# Patient Record
Sex: Male | Born: 1985 | Race: Black or African American | Hispanic: No | Marital: Single | Smoking: Current every day smoker
Health system: Southern US, Community
[De-identification: ages and names within clinical notes are randomized; demographics above are authoritative.]

## PROBLEM LIST (undated history)

## (undated) ENCOUNTER — Emergency Department: Payer: BC Managed Care – PPO

## (undated) DIAGNOSIS — I10 Essential (primary) hypertension: Secondary | ICD-10-CM

---
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
## 2005-09-01 ENCOUNTER — Emergency Department (HOSPITAL_COMMUNITY): Admission: EM | Admit: 2005-09-01 | Discharge: 2005-09-01 | Payer: Self-pay | Admitting: Family Medicine

## 2006-03-31 ENCOUNTER — Emergency Department (HOSPITAL_COMMUNITY): Admission: EM | Admit: 2006-03-31 | Discharge: 2006-03-31 | Payer: Self-pay | Admitting: Emergency Medicine

## 2008-03-26 ENCOUNTER — Emergency Department (HOSPITAL_COMMUNITY): Admission: EM | Admit: 2008-03-26 | Discharge: 2008-03-26 | Payer: Self-pay | Admitting: Emergency Medicine

## 2008-03-28 ENCOUNTER — Emergency Department (HOSPITAL_COMMUNITY): Admission: EM | Admit: 2008-03-28 | Discharge: 2008-03-28 | Payer: Self-pay | Admitting: Emergency Medicine

## 2008-09-06 ENCOUNTER — Emergency Department (HOSPITAL_COMMUNITY): Admission: EM | Admit: 2008-09-06 | Discharge: 2008-09-06 | Payer: Self-pay | Admitting: Emergency Medicine

## 2010-08-09 ENCOUNTER — Inpatient Hospital Stay (INDEPENDENT_AMBULATORY_CARE_PROVIDER_SITE_OTHER)
Admission: RE | Admit: 2010-08-09 | Discharge: 2010-08-09 | Disposition: A | Discharge status: Home / Self Care | Payer: Self-pay | Source: Ambulatory Visit | Attending: Emergency Medicine | Admitting: Emergency Medicine

## 2010-08-09 ENCOUNTER — Ambulatory Visit (INDEPENDENT_AMBULATORY_CARE_PROVIDER_SITE_OTHER): Payer: Self-pay

## 2010-08-09 DIAGNOSIS — J45909 Unspecified asthma, uncomplicated: Secondary | ICD-10-CM

## 2010-08-09 LAB — POCT URINALYSIS DIPSTICK
Hgb urine dipstick: NEGATIVE
Nitrite: NEGATIVE
Specific Gravity, Urine: >=1.03 (ref 1.005–1.030)
pH: 5.5 (ref 5.0–8.0)

## 2010-08-10 ENCOUNTER — Emergency Department (HOSPITAL_COMMUNITY)
Admission: EM | Admit: 2010-08-10 | Discharge: 2010-08-10 | Disposition: A | Discharge status: Home / Self Care | Payer: Self-pay | Attending: Emergency Medicine | Admitting: Emergency Medicine

## 2010-08-10 DIAGNOSIS — E669 Obesity, unspecified: Secondary | ICD-10-CM | POA: Insufficient documentation

## 2010-08-10 DIAGNOSIS — Z79899 Other long term (current) drug therapy: Secondary | ICD-10-CM | POA: Insufficient documentation

## 2010-08-10 DIAGNOSIS — F411 Generalized anxiety disorder: Secondary | ICD-10-CM | POA: Insufficient documentation

## 2010-08-10 DIAGNOSIS — J45909 Unspecified asthma, uncomplicated: Secondary | ICD-10-CM | POA: Insufficient documentation

## 2010-08-10 DIAGNOSIS — F1210 Cannabis abuse, uncomplicated: Secondary | ICD-10-CM | POA: Insufficient documentation

## 2010-08-11 ENCOUNTER — Emergency Department (HOSPITAL_COMMUNITY)
Admission: EM | Admit: 2010-08-11 | Discharge: 2010-08-12 | Disposition: A | Discharge status: Home / Self Care | Payer: Self-pay | Attending: Emergency Medicine | Admitting: Emergency Medicine

## 2010-08-11 DIAGNOSIS — J45909 Unspecified asthma, uncomplicated: Secondary | ICD-10-CM | POA: Insufficient documentation

## 2010-08-11 DIAGNOSIS — R9431 Abnormal electrocardiogram [ECG] [EKG]: Secondary | ICD-10-CM | POA: Insufficient documentation

## 2010-08-11 DIAGNOSIS — R079 Chest pain, unspecified: Secondary | ICD-10-CM | POA: Insufficient documentation

## 2010-08-11 DIAGNOSIS — F411 Generalized anxiety disorder: Secondary | ICD-10-CM | POA: Insufficient documentation

## 2010-08-11 DIAGNOSIS — H5789 Other specified disorders of eye and adnexa: Secondary | ICD-10-CM | POA: Insufficient documentation

## 2010-08-12 ENCOUNTER — Observation Stay (HOSPITAL_COMMUNITY)
Admission: EM | Admit: 2010-08-12 | Discharge: 2010-08-13 | Disposition: A | Discharge status: Home / Self Care | Payer: Self-pay | Attending: Internal Medicine | Admitting: Internal Medicine

## 2010-08-12 DIAGNOSIS — F411 Generalized anxiety disorder: Secondary | ICD-10-CM | POA: Insufficient documentation

## 2010-08-12 DIAGNOSIS — I10 Essential (primary) hypertension: Secondary | ICD-10-CM | POA: Insufficient documentation

## 2010-08-12 DIAGNOSIS — J45909 Unspecified asthma, uncomplicated: Secondary | ICD-10-CM | POA: Insufficient documentation

## 2010-08-12 DIAGNOSIS — R079 Chest pain, unspecified: Principal | ICD-10-CM | POA: Insufficient documentation

## 2010-08-12 DIAGNOSIS — R7309 Other abnormal glucose: Secondary | ICD-10-CM | POA: Insufficient documentation

## 2010-08-12 DIAGNOSIS — F17200 Nicotine dependence, unspecified, uncomplicated: Secondary | ICD-10-CM | POA: Insufficient documentation

## 2010-08-12 LAB — PROTIME-INR: INR: 0.95 (ref 0.00–1.49)

## 2010-08-12 LAB — POCT CARDIAC MARKERS
CKMB, poc: <1 ng/mL — ABNORMAL LOW (ref 1.0–8.0)
Troponin i, poc: <0.05 ng/mL (ref 0.00–0.09)
Troponin i, poc: <0.05 ng/mL (ref 0.00–0.09)

## 2010-08-12 LAB — URINALYSIS, ROUTINE W REFLEX MICROSCOPIC
Ketones, ur: NEGATIVE mg/dL
Nitrite: NEGATIVE
Protein, ur: NEGATIVE mg/dL
Specific Gravity, Urine: 1.007 (ref 1.005–1.030)
Urine Glucose, Fasting: NEGATIVE mg/dL

## 2010-08-12 LAB — DIFFERENTIAL
Basophils Relative: 1 % (ref 0–1)
Eosinophils Absolute: 0.1 10*3/uL (ref 0.0–0.7)
Neutrophils Relative %: 57 % (ref 43–77)

## 2010-08-12 LAB — CBC
MCH: 26.2 pg (ref 26.0–34.0)
Platelets: 237 10*3/uL (ref 150–400)
RBC: 5.61 MIL/uL (ref 4.22–5.81)
WBC: 6.1 10*3/uL (ref 4.0–10.5)

## 2010-08-12 LAB — CARDIAC PANEL(CRET KIN+CKTOT+MB+TROPI): CK, MB: 1.3 ng/mL (ref 0.3–4.0)

## 2010-08-12 LAB — POCT I-STAT, CHEM 8
Calcium, Ion: 1.06 mmol/L — ABNORMAL LOW (ref 1.12–1.32)
HCT: 49 % (ref 39.0–52.0)
TCO2: 22 mmol/L (ref 0–100)

## 2010-08-12 LAB — CK TOTAL AND CKMB (NOT AT ARMC): Relative Index: 1.4 (ref 0.0–2.5)

## 2010-08-12 LAB — RAPID URINE DRUG SCREEN, HOSP PERFORMED
Amphetamines: NOT DETECTED
Cocaine: NOT DETECTED
Tetrahydrocannabinol: NOT DETECTED

## 2010-08-12 LAB — APTT: aPTT: 25 seconds (ref 24–37)

## 2010-08-12 LAB — BRAIN NATRIURETIC PEPTIDE: Pro B Natriuretic peptide (BNP): <30 pg/mL (ref 0.0–100.0)

## 2010-08-12 LAB — TSH: TSH: 0.4 u[IU]/mL (ref 0.350–4.500)

## 2010-08-12 NOTE — H&P (Signed)
Christian Wright, FARACE                 ACCOUNT NO.:  [id number]  MEDICAL RECORD NO.:  [id number]           PATIENT TYPE:  E  LOCATION:  [facility]                         FACILITY:  [facility]  PHYSICIAN:  Conley Canal, MD      DATE OF BIRTH:  1986/05/17  DATE OF ADMISSION:  08/12/2010 DATE OF DISCHARGE:                             HISTORY & PHYSICAL   The patient has no primary care physician.  CHIEF COMPLAINT:  Left-sided chest pain, times a few days.  HISTORY OF PRESENT ILLNESS:  This is a 25 year old male with a history of tobacco abuse, hypertension, not on medications, morbid obesity, tobacco habituation, bronchial asthma, anxiety disorder came in with complaints of left-sided chest pain which he says is on and off, no relieving or aggravating factors.  It has lately been associated with numbness of the left arm.  He denies any diaphoresis.  No nausea or vomiting.  The patient currently is chest-pain free.  The pain is mild and in the emergency room the patient had chest x-ray which was unrevealing.  He also had an EKG which showed some T-wave flattening in the septal leads hence referred to hospitalist's service for admission. His D-dimer was negative.  PAST MEDICAL HISTORY: 1. Tobacco habituation. 2. Hypertension. 3. Morbid obesity. 4. Panic anxiety disorder.  HOME MEDICATIONS:  None.  ALLERGIES:  NO KNOWN DRUG ALLERGIES.  SOCIAL HISTORY:  The patient smokes cigarettes occasionally, drinks alcohol, and has history of polysubstance abuse with cocaine and marijuana.  FAMILY HISTORY:  Positive for diabetes mellitus, hypertension, heart disease on his father's and mother's side.  REVIEW OF SYSTEMS:  Unremarkable except as highlighted in the history of present illness.  PHYSICAL EXAMINATION:  GENERAL:  This is a young male who is not in acute distress accompanied by his aunt at the bedside. VITAL SIGNS:  Blood pressure 166/90, heart rate is 86, temperature 98.8, respirations  16, oxygen saturation is 98% on room air.  HEAD, EARS, NOSE AND THROAT:  Pupils equal and reacting to light.  No jugular venous distention.  No carotid bruits. CARDIAC:  First, second heart sounds heard.  No murmurs.  Pulse regular. ABDOMEN:  Soft, nontender.  No palpable organomegaly.  Bowel sounds are normal. CNS:  The patient is alert and oriented to person, place, and time with no acute focal neurological deficits. EXTREMITIES:  No pedal edema.  Peripheral pulses equal.  IMPRESSION:  A 25 year old male who is presenting with chest pain on and off concerning for acute coronary syndrome. 1. Chest pain.  We will admit the patient to telemetry observation,     send serial cardiac enzymes, obtain 2-D echocardiogram.  He may     need stress test.  Meanwhile, will be on nitroglycerin, aspirin,     beta blocker, statin.  We will obtain lipids panel, TSH. 2. Hypertension.  We will start beta-blocker, possibly add calcium     channel blocker depending on how he is doing. 3. Tobacco habituation, polysubstance abuse, cessation counseling was     given.  We will provide nicotine patch. 4. Bronchial asthma.  The patient occasionally uses albuterol  inhaler,     currently stable.  Continue albuterol inhalation as needed. 5. The patient's condition is fair.     Conley Canal, MD     SR/MEDQ  D:  08/12/2010  T:  08/12/2010  Job:  [id number]  Electronically Signed by Conley Canal  on 08/12/2010 05:23:59 PM

## 2010-08-13 LAB — URINE CULTURE
Colony Count: NO GROWTH
Culture  Setup Time: 201202291529
Culture: NO GROWTH

## 2010-08-13 LAB — CARDIAC PANEL(CRET KIN+CKTOT+MB+TROPI)
CK, MB: 1.2 ng/mL (ref 0.3–4.0)
Relative Index: 1.1 (ref 0.0–2.5)
Total CK: 109 U/L (ref 7–232)
Troponin I: 0.01 ng/mL (ref 0.00–0.06)

## 2010-08-13 LAB — LIPID PANEL: Triglycerides: 157 mg/dL — ABNORMAL HIGH (ref <150)

## 2010-08-18 ENCOUNTER — Emergency Department (HOSPITAL_COMMUNITY)
Admission: EM | Admit: 2010-08-18 | Discharge: 2010-08-19 | Disposition: A | Discharge status: Home / Self Care | Payer: Self-pay | Attending: Emergency Medicine | Admitting: Emergency Medicine

## 2010-08-18 DIAGNOSIS — I10 Essential (primary) hypertension: Secondary | ICD-10-CM | POA: Insufficient documentation

## 2010-08-18 DIAGNOSIS — J45909 Unspecified asthma, uncomplicated: Secondary | ICD-10-CM | POA: Insufficient documentation

## 2010-08-18 DIAGNOSIS — R209 Unspecified disturbances of skin sensation: Secondary | ICD-10-CM | POA: Insufficient documentation

## 2010-08-18 LAB — POCT I-STAT, CHEM 8
Calcium, Ion: 1.07 mmol/L — ABNORMAL LOW (ref 1.12–1.32)
Glucose, Bld: 120 mg/dL — ABNORMAL HIGH (ref 70–99)
HCT: 54 % — ABNORMAL HIGH (ref 39.0–52.0)
Hemoglobin: 18.4 g/dL — ABNORMAL HIGH (ref 13.0–17.0)
TCO2: 24 mmol/L (ref 0–100)

## 2010-08-23 NOTE — Discharge Summary (Signed)
  NAMEANDRELL, Christian Wright                 ACCOUNT NO.:  [id number]  MEDICAL RECORD NO.:  [id number]           PATIENT TYPE:  I  LOCATION:  3733                         FACILITY:  [facility]  PHYSICIAN:  Lonia Blood, M.D.       DATE OF BIRTH:  1985/12/12  DATE OF ADMISSION:  08/12/2010 DATE OF DISCHARGE:  08/13/2010                              DISCHARGE SUMMARY   PRIMARY CARE PHYSICIAN:  This patient has been referred to [facility].  DISCHARGE DIAGNOSES: 1. Left-sided chest pain, probably musculoskeletal in nature. 2. Hypertension. 3. Morbid obesity. 4. Impaired glucose tolerance with metabolic syndrome.  DISCHARGE MEDICATIONS: 1. Albuterol inhaled 2 puffs twice a day as needed. 2. Nicotine 7 mg transdermally daily. 3. Triamterene/HCTZ 37.5/25 daily.  PROCEDURE DURING THIS ADMISSION:  Christian Wright underwent chest x-ray PA and lateral, which was showing normal chest.  CONSULTATION DURING ADMISSION:  No consultations obtained.  HISTORY AND PHYSICAL:  Refer to dictated H and P done by Dr. Venetia Constable.  HOSPITAL COURSE:  Christian Wright is a 25 year old gentleman with tobacco abuse and obesity, was admitted from the emergency room with chest pain. Even though his EKG was interpreted as abnormal, I have personally reviewed the EKG and it looked completely normal.  Three sets of cardiac enzymes were within normal limits.  The patient's chest pain subsided. It sounded more musculoskeletal in nature than angina.  Christian Wright was educated about risk factor reduction, namely tobacco abuse, obesity, and hypertension.  He was set up with a new primary care physician at [facility].     Lonia Blood, M.D.     SL/MEDQ  D:  08/17/2010  T:  08/17/2010  Job:  [id number]  Electronically Signed by Lonia Blood M.D. on 08/23/2010 08:54:09 AM

## 2010-09-24 LAB — DIFFERENTIAL
Basophils Absolute: 0 10*3/uL (ref 0.0–0.1)
Basophils Relative: 0 % (ref 0–1)
Eosinophils Absolute: 0.1 10*3/uL (ref 0.0–0.7)
Neutro Abs: 3.3 10*3/uL (ref 1.7–7.7)
Neutrophils Relative %: 61 % (ref 43–77)

## 2010-09-24 LAB — RAPID URINE DRUG SCREEN, HOSP PERFORMED
Barbiturates: NOT DETECTED
Benzodiazepines: NOT DETECTED
Cocaine: NOT DETECTED
Opiates: NOT DETECTED

## 2010-09-24 LAB — CBC
MCHC: 34.5 g/dL (ref 30.0–36.0)
MCV: 78.7 fL (ref 78.0–100.0)
RDW: 13.5 % (ref 11.5–15.5)

## 2010-09-24 LAB — POCT I-STAT, CHEM 8
BUN: 14 mg/dL (ref 6–23)
Calcium, Ion: 1.05 mmol/L — ABNORMAL LOW (ref 1.12–1.32)
Creatinine, Ser: 1.2 mg/dL (ref 0.4–1.5)
Glucose, Bld: 137 mg/dL — ABNORMAL HIGH (ref 70–99)
Hemoglobin: 17.7 g/dL — ABNORMAL HIGH (ref 13.0–17.0)
Sodium: 142 mEq/L (ref 135–145)
TCO2: 23 mmol/L (ref 0–100)

## 2010-09-24 LAB — URINALYSIS, ROUTINE W REFLEX MICROSCOPIC
Glucose, UA: NEGATIVE mg/dL
Nitrite: NEGATIVE
Specific Gravity, Urine: 1.03 (ref 1.005–1.030)
pH: 6 (ref 5.0–8.0)

## 2010-09-24 LAB — TSH: TSH: 0.312 u[IU]/mL — ABNORMAL LOW (ref 0.350–4.500)

## 2010-09-24 LAB — POCT CARDIAC MARKERS: Myoglobin, poc: 53.1 ng/mL (ref 12–200)

## 2010-09-24 LAB — APTT: aPTT: 26 seconds (ref 24–37)

## 2010-09-24 LAB — PROTIME-INR: INR: 1.1 (ref 0.00–1.49)

## 2011-09-11 ENCOUNTER — Emergency Department (HOSPITAL_COMMUNITY)
Admission: EM | Admit: 2011-09-11 | Discharge: 2011-09-12 | Disposition: A | Discharge status: Home / Self Care | Payer: Self-pay | Attending: Emergency Medicine | Admitting: Emergency Medicine

## 2011-09-11 ENCOUNTER — Encounter (HOSPITAL_COMMUNITY): Payer: Self-pay | Admitting: Emergency Medicine

## 2011-09-11 DIAGNOSIS — J45909 Unspecified asthma, uncomplicated: Secondary | ICD-10-CM | POA: Insufficient documentation

## 2011-09-11 DIAGNOSIS — K602 Anal fissure, unspecified: Secondary | ICD-10-CM | POA: Insufficient documentation

## 2011-09-11 LAB — OCCULT BLOOD, POC DEVICE: Fecal Occult Bld: NEGATIVE

## 2011-09-11 MED ORDER — HYDROCORTISONE ACETATE 25 MG RE SUPP: 25.0000 mg | Freq: Two times a day (BID) | RECTAL | Status: AC

## 2011-09-11 NOTE — ED Notes (Signed)
Called pt twice no answer.

## 2011-09-11 NOTE — ED Notes (Signed)
PT. REPORTS RECTAL PAIN AFTER BOWEL MOVEMENT ONSET 3 DAYS AGO  , DENIES BLEEDING OR CONSTIPATION .

## 2011-09-11 NOTE — Discharge Instructions (Signed)
Anal Fissure, Adult An anal fissure is a small tear or crack in the skin around the anus. Bleeding from a fissure usually stops on its own within a few minutes. However, bleeding will often reoccur with each bowel movement until the crack heals.  CAUSES   Passing large, hard stools.   Frequent diarrheal stools.   Constipation.   Inflammatory bowel disease (Crohn's disease or ulcerative colitis).   Infections.   Anal sex.  SYMPTOMS   Small amounts of blood seen on your stools, on toilet paper, or in the toilet after a bowel movement.   Rectal bleeding.   Painful bowel movements.   Itching or irritation around the anus.  DIAGNOSIS Your caregiver will examine the anal area. An anal fissure can usually be seen with careful inspection. A rectal exam may be performed and a short tube (anoscope) may be used to examine the anal canal. TREATMENT   You may be instructed to take fiber supplements. These supplements can soften your stool to help make bowel movements easier.   Sitz baths may be recommended to help heal the tear. Do not use soap in the sitz baths.   A medicated cream or ointment may be prescribed to lessen discomfort.  HOME CARE INSTRUCTIONS   Maintain a diet high in fruits, whole grains, and vegetables. Avoid constipating foods like bananas and dairy products.   Take sitz baths as directed by your caregiver.   Drink enough fluids to keep your urine clear or pale yellow.   Only take over-the-counter or prescription medicines for pain, discomfort, or fever as directed by your caregiver. Do not take aspirin as this may increase bleeding.   Do not use ointments containing numbing medications (anesthetics) or hydrocortisone. They could slow healing.  SEEK MEDICAL CARE IF:   Your fissure is not completely healed within 3 days.   You have further bleeding.   You have a fever.   You have diarrhea mixed with blood.   You have pain.   Your problem is getting worse  rather than better.  MAKE SURE YOU:   Understand these instructions.   Will watch your condition.   Will get help right away if you are not doing well or get worse.  Document Released: 05/31/2005 Document Revised: 05/20/2011 Document Reviewed: 11/15/2010 [employer] Patient Information 2012 [employer]. 

## 2011-09-11 NOTE — ED Provider Notes (Signed)
History     CSN: [id number]  Arrival date & time 09/11/11  2030   First MD Initiated Contact with Patient 09/11/11 2300      Chief Complaint  Patient presents with  . Rectal Pain    (Consider location/radiation/quality/duration/timing/severity/associated sxs/prior treatment) HPI Comments: Patient here with rectal pain - states that he had diarrhea about 4-5 days ago and thinks that he may have irritated the rectal area, states now pain with bowel movement and blood on the tissue when he wipes - no fever, chills, abdominal pain, nausea, vomiting - states the diarrhea has eased.  Patient is a 26 y.o. male presenting with hematochezia. The history is provided by the patient. No language interpreter was used.  Rectal Bleeding  The current episode started 2 days ago. The onset was gradual. The problem occurs rarely. The problem has been unchanged. The pain is severe. The stool is described as liquid. There was no prior successful therapy. There was no prior unsuccessful therapy. Associated symptoms include diarrhea and rectal pain. Pertinent negatives include no anorexia, no fever, no abdominal pain, no hematemesis, no hemorrhoids, no nausea, no vomiting, no hematuria, no vaginal bleeding, no vaginal discharge, no chest pain, no headaches, no coughing, no difficulty breathing and no rash. He has been eating and drinking normally. Urine output has been normal.    Past Medical History  Diagnosis Date  . Asthma     History reviewed. No pertinent past surgical history.  History reviewed. No pertinent family history.  History  Substance Use Topics  . Smoking status: Current Everyday Smoker -- 0.5 packs/day  . Smokeless tobacco: Not on file  . Alcohol Use: 2.5 oz/week    5 drink(s) per week      Review of Systems  Constitutional: Negative for fever.  Respiratory: Negative for cough.   Cardiovascular: Negative for chest pain.  Gastrointestinal: Positive for diarrhea, blood in stool,  hematochezia and rectal pain. Negative for nausea, vomiting, abdominal pain, constipation, anorexia, hematemesis and hemorrhoids.  Genitourinary: Negative for hematuria, vaginal bleeding and vaginal discharge.  Skin: Negative for rash.  Neurological: Negative for headaches.  All other systems reviewed and are negative.    Allergies  Review of patient's allergies indicates no known allergies.  Home Medications   Current Outpatient Rx  Name Route Sig Dispense Refill  . ALBUTEROL SULFATE HFA 108 (90 BASE) MCG/ACT IN AERS Inhalation Inhale 2 puffs into the lungs every 6 (six) hours as needed. For wheezing    . AMLODIPINE BESYLATE PO Oral Take 1 tablet by mouth daily.    Marland Kitchen HYDROCODONE-ACETAMINOPHEN 7.5-325 MG PO TABS Oral Take 1 tablet by mouth every 6 (six) hours as needed. For pain    . ADULT MULTIVITAMIN W/MINERALS CH Oral Take 1 tablet by mouth daily.    Marland Kitchen PRESCRIPTION MEDICATION Oral Take 1 tablet by mouth daily. Pt takes a combination blood pressure med that has Hydrochlorothizide in it. Unable to verify medicaition because [facility] is closed. RX Capture did not yield any results.      BP 155/92  Pulse 93  Temp(Src) 99.4 F (37.4 C) (Oral)  Resp 20  SpO2 99%  Physical Exam  Nursing note and vitals reviewed. Constitutional: He is oriented to person, place, and time. He appears well-developed and well-nourished. No distress.  HENT:  Head: Normocephalic and atraumatic.  Right Ear: External ear normal.  Left Ear: External ear normal.  Nose: Nose normal.  Mouth/Throat: Oropharynx is clear and moist. No oropharyngeal exudate.  Eyes: Conjunctivae  are normal. Pupils are equal, round, and reactive to light. No scleral icterus.  Neck: Normal range of motion. Neck supple.  Cardiovascular: Normal rate, regular rhythm and normal heart sounds.  Exam reveals no gallop and no friction rub.   No murmur heard. Pulmonary/Chest: Effort normal and breath sounds normal. No respiratory distress.  He has no wheezes. He has no rales. He exhibits no tenderness.  Abdominal: Soft. Bowel sounds are normal. He exhibits no distension and no mass. There is no tenderness. There is no rebound and no guarding.  Genitourinary: Rectal exam shows fissure. Rectal exam shows no external hemorrhoid, no internal hemorrhoid, no mass and anal tone normal. Guaiac negative stool.  Musculoskeletal: Normal range of motion. He exhibits no edema and no tenderness.  Lymphadenopathy:    He has no cervical adenopathy.  Neurological: He is alert and oriented to person, place, and time. No cranial nerve deficit.  Skin: Skin is warm and dry. No rash noted. No erythema. No pallor.  Psychiatric: He has a normal mood and affect. His behavior is normal. Judgment and thought content normal.    ED Course  Procedures (including critical care time)   Labs Reviewed  OCCULT BLOOD, POC DEVICE   No results found.   Anal Fissure    MDM  Patient with rectal fissure based on examination.  Will start on anusol suppositories for the pain - hemoccult negative.        Izola Price [place], [place] 09/11/11 2352

## 2011-09-13 NOTE — ED Provider Notes (Signed)
Medical screening examination/treatment/procedure(s) were performed by non-physician practitioner and as supervising physician I was immediately available for consultation/collaboration.   Laray Anger, DO 09/13/11 [phone]

## 2013-04-24 ENCOUNTER — Encounter (HOSPITAL_COMMUNITY): Payer: Self-pay | Admitting: Emergency Medicine

## 2013-04-24 ENCOUNTER — Emergency Department (INDEPENDENT_AMBULATORY_CARE_PROVIDER_SITE_OTHER)
Admission: EM | Admit: 2013-04-24 | Discharge: 2013-04-24 | Disposition: A | Discharge status: Home / Self Care | Payer: Self-pay | Source: Home / Self Care | Attending: Family Medicine | Admitting: Family Medicine

## 2013-04-24 DIAGNOSIS — J45901 Unspecified asthma with (acute) exacerbation: Secondary | ICD-10-CM

## 2013-04-24 DIAGNOSIS — I10 Essential (primary) hypertension: Secondary | ICD-10-CM

## 2013-04-24 HISTORY — DX: Essential (primary) hypertension: I10

## 2013-04-24 MED ORDER — TRIAMTERENE-HCTZ 75-50 MG PO TABS: 1.0000 | ORAL_TABLET | Freq: Every day | ORAL | Status: DC

## 2013-04-24 MED ORDER — ALBUTEROL SULFATE (5 MG/ML) 0.5% IN NEBU
5.0000 mg | INHALATION_SOLUTION | Freq: Once | RESPIRATORY_TRACT | Status: AC
  Administered 2013-04-24: 5 mg via RESPIRATORY_TRACT

## 2013-04-24 MED ORDER — PREDNISONE 20 MG PO TABS
ORAL_TABLET | ORAL | Status: AC
  Filled 2013-04-24: qty 2

## 2013-04-24 MED ORDER — PREDNISONE 20 MG PO TABS
40.0000 mg | ORAL_TABLET | Freq: Once | ORAL | Status: AC
  Administered 2013-04-24: 40 mg via ORAL

## 2013-04-24 MED ORDER — HYDROCODONE-ACETAMINOPHEN 5-325 MG PO TABS: 0.5000 | ORAL_TABLET | Freq: Every evening | ORAL | Status: DC | PRN

## 2013-04-24 MED ORDER — AMLODIPINE BESYLATE 10 MG PO TABS: 10.0000 mg | ORAL_TABLET | Freq: Every day | ORAL | Status: DC

## 2013-04-24 MED ORDER — IPRATROPIUM BROMIDE 0.02 % IN SOLN
RESPIRATORY_TRACT | Status: AC
  Filled 2013-04-24: qty 2.5

## 2013-04-24 MED ORDER — ALBUTEROL SULFATE (5 MG/ML) 0.5% IN NEBU
INHALATION_SOLUTION | RESPIRATORY_TRACT | Status: AC
  Filled 2013-04-24: qty 1

## 2013-04-24 MED ORDER — PREDNISONE 50 MG PO TABS: 50.0000 mg | ORAL_TABLET | Freq: Every day | ORAL | Status: DC

## 2013-04-24 MED ORDER — ALBUTEROL SULFATE (5 MG/ML) 0.5% IN NEBU: 5.0000 mg | INHALATION_SOLUTION | RESPIRATORY_TRACT | Status: DC | PRN

## 2013-04-24 MED ORDER — IPRATROPIUM BROMIDE 0.02 % IN SOLN
0.5000 mg | Freq: Once | RESPIRATORY_TRACT | Status: AC
  Administered 2013-04-24: 0.5 mg via RESPIRATORY_TRACT

## 2013-04-24 NOTE — ED Notes (Signed)
C/o waking up yesterday with productive cough white and clear sputum,  and wheezing.  Audible wheezing.  BP elevated. Has been out of BP medicine for 2-3 [place].  No fever, sore throat, earache but has had a runny nose.

## 2013-04-24 NOTE — ED Provider Notes (Signed)
Christian Wright is a 27 y.o. male who presents to Urgent Care today for wheezing coughing congestion for 1-2 days. Patient has a medical history for asthma. He notes worsening wheezing starting yesterday. He does not have albuterol inhalers or nebulizer. He denies any chest pain palpitations nausea vomiting or diarrhea. He does not cough and nasal congestion. He's tried some over-the-counter medications for cough which have not helped much. Additionally patient notes that he has run out of his blood pressure medications. He denies any chest pains headache syncope or dizziness.   Past Medical History  Diagnosis Date  . Asthma   . Hypertension    History  Substance Use Topics  . Smoking status: Current Every Day Smoker -- 0.50 packs/day    Types: Cigarettes  . Smokeless tobacco: Not on file  . Alcohol Use: 2.5 oz/week    5 drink(s) per week     Comment: occasional   ROS as above Medications reviewed. Current Facility-Administered Medications  Medication Dose Route Frequency Provider Last Rate Last Dose  . albuterol (PROVENTIL) (5 MG/ML) 0.5% nebulizer solution 5 mg  5 mg Nebulization Once Rodolph Bong, MD       Current Outpatient Prescriptions  Medication Sig Dispense Refill  . albuterol (PROVENTIL HFA;VENTOLIN HFA) 108 (90 BASE) MCG/ACT inhaler Inhale 2 puffs into the lungs every 6 (six) hours as needed. For wheezing      . Multiple Vitamin (MULITIVITAMIN WITH MINERALS) TABS Take 1 tablet by mouth daily.      Marland Kitchen albuterol (PROVENTIL) (5 MG/ML) 0.5% nebulizer solution Take 1 mL (5 mg total) by nebulization every 4 (four) hours as needed for wheezing or shortness of breath.  20 mL  2  . amLODipine (NORVASC) 10 MG tablet Take 1 tablet (10 mg total) by mouth daily.  30 tablet  0  . HYDROcodone-acetaminophen (NORCO/VICODIN) 5-325 MG per tablet Take 0.5 tablets by mouth at bedtime as needed (cough).  6 tablet  0  . predniSONE (DELTASONE) 50 MG tablet Take 1 tablet (50 mg total) by mouth  daily.  5 tablet  0  . triamterene-hydrochlorothiazide (MAXZIDE) 75-50 MG per tablet Take 1 tablet by mouth daily.  30 tablet  0    Exam:  BP 209/119  Pulse 126  Temp(Src) 98.5 F (36.9 C) (Oral)  Resp 24  SpO2 96% Gen: Well NAD HEENT: EOMI,  MMM Lungs: Increased work of breathing. Wheezing present bilaterally Heart: RRR no MRG Abd: NABS, NT, ND Exts: Non edematous BL  LE, warm and well perfused.   Patient was given DuoNeb nebulizer treatment. He had considerable improvement in symptoms  Assessment and Plan: 27 y.o. male with asthma exacerbation. Plan to treat with albuterol, and prednisone. Additionally treat cough with Norco tablet.   Additionally patient has uncontrolled hypertension. He is not taking any medications. Plan to restart his Maxzide and amlodipine and followup with primary care provider. Discussed warning signs or symptoms. Please see discharge instructions. Patient expresses understanding.      Rodolph Bong, MD 04/24/13 2038

## 2013-04-29 ENCOUNTER — Emergency Department (HOSPITAL_COMMUNITY): Payer: Self-pay

## 2013-04-29 ENCOUNTER — Encounter (HOSPITAL_COMMUNITY): Payer: Self-pay | Admitting: Emergency Medicine

## 2013-04-29 ENCOUNTER — Emergency Department (HOSPITAL_COMMUNITY)
Admission: EM | Admit: 2013-04-29 | Discharge: 2013-04-29 | Disposition: A | Discharge status: Home / Self Care | Payer: Self-pay | Attending: Emergency Medicine | Admitting: Emergency Medicine

## 2013-04-29 DIAGNOSIS — I10 Essential (primary) hypertension: Secondary | ICD-10-CM | POA: Insufficient documentation

## 2013-04-29 DIAGNOSIS — R059 Cough, unspecified: Secondary | ICD-10-CM

## 2013-04-29 DIAGNOSIS — R109 Unspecified abdominal pain: Secondary | ICD-10-CM | POA: Insufficient documentation

## 2013-04-29 DIAGNOSIS — Z79899 Other long term (current) drug therapy: Secondary | ICD-10-CM | POA: Insufficient documentation

## 2013-04-29 DIAGNOSIS — F17200 Nicotine dependence, unspecified, uncomplicated: Secondary | ICD-10-CM | POA: Insufficient documentation

## 2013-04-29 DIAGNOSIS — IMO0002 Reserved for concepts with insufficient information to code with codable children: Secondary | ICD-10-CM | POA: Insufficient documentation

## 2013-04-29 DIAGNOSIS — R05 Cough: Secondary | ICD-10-CM

## 2013-04-29 DIAGNOSIS — J45901 Unspecified asthma with (acute) exacerbation: Secondary | ICD-10-CM | POA: Insufficient documentation

## 2013-04-29 MED ORDER — ALBUTEROL SULFATE (5 MG/ML) 0.5% IN NEBU
5.0000 mg | INHALATION_SOLUTION | Freq: Once | RESPIRATORY_TRACT | Status: AC
  Administered 2013-04-29: 5 mg via RESPIRATORY_TRACT
  Filled 2013-04-29: qty 1

## 2013-04-29 MED ORDER — IPRATROPIUM BROMIDE 0.02 % IN SOLN
0.5000 mg | Freq: Once | RESPIRATORY_TRACT | Status: AC
  Administered 2013-04-29: 0.5 mg via RESPIRATORY_TRACT
  Filled 2013-04-29: qty 2.5

## 2013-04-29 MED ORDER — ALBUTEROL SULFATE (5 MG/ML) 0.5% IN NEBU: 2.5000 mg | INHALATION_SOLUTION | Freq: Four times a day (QID) | RESPIRATORY_TRACT | Status: DC | PRN

## 2013-04-29 NOTE — ED Notes (Addendum)
Pt states short of breath since 04/27/2013, reports pain on right flank when he takes a breadth. No signs of distress.

## 2013-04-29 NOTE — ED Provider Notes (Signed)
CSN: [id number]     Arrival date & time 04/29/13  1727 History   First MD Initiated Contact with Patient 04/29/13 1732     This chart was scribed for non-physician practitioner, Teressa Lower, NP working with Hilario Quarry, MD by Arlan Organ, ED Scribe. This patient was seen in room TR09C/TR09C and the patient's care was started at 5:46 PM.   No chief complaint on file.  The history is provided by the patient. No language interpreter was used.   HPI Comments: Christian Wright is a 27 y.o. male with a hx of asthma and HTN who presents to the Emergency Department complaining of SOB that started 1 Anthis ago. Pt also reports right flank pain that recently started 2 days ago, along with a productive cough consisting of white sputum.. Pt says he was recent seen on 11/11 at Urgent Care, and was prescribed albuterol and prednisone. Pt denies fever or CP. He is currently a smoker, and smokes about 0.5 packs a day.  Past Medical History  Diagnosis Date  . Asthma   . Hypertension    No past surgical history on file. Family History  Problem Relation Age of Onset  . Hypertension Father   . Cancer Father     colon   History  Substance Use Topics  . Smoking status: Current Every Day Smoker -- 0.50 packs/day    Types: Cigarettes  . Smokeless tobacco: Not on file  . Alcohol Use: 2.5 oz/week    5 drink(s) per week     Comment: occasional    Review of Systems  HENT: Positive for congestion (nasal).   Respiratory: Positive for shortness of breath.     Allergies  Review of patient's allergies indicates no known allergies.  Home Medications   Current Outpatient Rx  Name  Route  Sig  Dispense  Refill  . albuterol (PROVENTIL HFA;VENTOLIN HFA) 108 (90 BASE) MCG/ACT inhaler   Inhalation   Inhale 2 puffs into the lungs every 6 (six) hours as needed. For wheezing         . albuterol (PROVENTIL) (5 MG/ML) 0.5% nebulizer solution   Nebulization   Take 1 mL (5 mg total) by nebulization  every 4 (four) hours as needed for wheezing or shortness of breath.   20 mL   2   . amLODipine (NORVASC) 10 MG tablet   Oral   Take 1 tablet (10 mg total) by mouth daily.   30 tablet   0   . HYDROcodone-acetaminophen (NORCO/VICODIN) 5-325 MG per tablet   Oral   Take 0.5 tablets by mouth at bedtime as needed (cough).   6 tablet   0   . Multiple Vitamin (MULITIVITAMIN WITH MINERALS) TABS   Oral   Take 1 tablet by mouth daily.         . predniSONE (DELTASONE) 50 MG tablet   Oral   Take 1 tablet (50 mg total) by mouth daily.   5 tablet   0   . triamterene-hydrochlorothiazide (MAXZIDE) 75-50 MG per tablet   Oral   Take 1 tablet by mouth daily.   30 tablet   0    SpO2 96% Physical Exam  Nursing note and vitals reviewed. Constitutional: He is oriented to person, place, and time. He appears well-developed and well-nourished.  HENT:  Head: Normocephalic and atraumatic.  Eyes: EOM are normal.  Neck: Normal range of motion.  Cardiovascular: Normal rate.   Pulmonary/Chest: He has wheezes.  Wheezing in the bases  Musculoskeletal: Normal range of motion.  Neurological: He is alert and oriented to person, place, and time.  Skin: Skin is warm and dry.  Psychiatric: He has a normal mood and affect. His behavior is normal.    ED Course  Procedures (including critical care time)  DIAGNOSTIC STUDIES: Oxygen Saturation is 96% on RA, Adequate by my interpretation.    COORDINATION OF CARE: 5:48 PM-Discussed treatment plan with pt at bedside and pt agreed to plan.     Labs Review Labs Reviewed - No data to display Imaging Review Dg Chest 2 View  04/29/2013   CLINICAL DATA:  Right-sided chest pain. Cough and chest congestion. Shortness of breath.  EXAM: CHEST  2 VIEW  COMPARISON:  08/09/2010  FINDINGS: The heart size and mediastinal contours are within normal limits. Both lungs are clear. The visualized skeletal structures are unremarkable.  IMPRESSION: No active  cardiopulmonary disease.   Electronically Signed   By: Geanie Cooley M.D.   On: 04/29/2013 19:30    EKG Interpretation   None       MDM   1. Cough    No pneumonia noted:discomfort likely related to the cough:pt was given vicodin at urgent care that he can have filled:pt no longer wheezing after treatment  I personally performed the services described in this documentation, which was scribed in my presence. The recorded information has been reviewed and is accurate.    Teressa Lower, NP 04/29/13 1955

## 2013-04-29 NOTE — ED Notes (Signed)
Pt in Xray at this time.

## 2013-05-01 ENCOUNTER — Emergency Department (HOSPITAL_COMMUNITY)
Admission: EM | Admit: 2013-05-01 | Discharge: 2013-05-01 | Disposition: A | Discharge status: Home / Self Care | Payer: Self-pay | Attending: Emergency Medicine | Admitting: Emergency Medicine

## 2013-05-01 ENCOUNTER — Encounter (HOSPITAL_COMMUNITY): Payer: Self-pay | Admitting: Emergency Medicine

## 2013-05-01 DIAGNOSIS — I10 Essential (primary) hypertension: Secondary | ICD-10-CM | POA: Insufficient documentation

## 2013-05-01 DIAGNOSIS — Z79899 Other long term (current) drug therapy: Secondary | ICD-10-CM | POA: Insufficient documentation

## 2013-05-01 DIAGNOSIS — R071 Chest pain on breathing: Secondary | ICD-10-CM | POA: Insufficient documentation

## 2013-05-01 DIAGNOSIS — R0781 Pleurodynia: Secondary | ICD-10-CM

## 2013-05-01 DIAGNOSIS — F17200 Nicotine dependence, unspecified, uncomplicated: Secondary | ICD-10-CM | POA: Insufficient documentation

## 2013-05-01 DIAGNOSIS — J45909 Unspecified asthma, uncomplicated: Secondary | ICD-10-CM | POA: Insufficient documentation

## 2013-05-01 MED ORDER — PREDNISONE 20 MG PO TABS: 40.0000 mg | ORAL_TABLET | Freq: Every day | ORAL | Status: DC

## 2013-05-01 MED ORDER — PREDNISONE 20 MG PO TABS
60.0000 mg | ORAL_TABLET | Freq: Once | ORAL | Status: AC
  Administered 2013-05-01: 60 mg via ORAL
  Filled 2013-05-01: qty 3

## 2013-05-01 NOTE — ED Provider Notes (Signed)
Medical screening examination/treatment/procedure(s) were performed by non-physician practitioner and as supervising physician I was immediately available for consultation/collaboration.  Birda Didonato T Garfield Coiner, MD 05/01/13 2323 

## 2013-05-01 NOTE — ED Provider Notes (Signed)
CSN: [id number]     Arrival date & time 05/01/13  1644 History  This chart was scribed for non-physician practitioner Sharilyn Sites, PA-C working with Toy Baker, MD by Danella Maiers, ED Scribe. This patient was seen in room TR06C/TR06C and the patient's care was started at 5:00 PM.   Chief Complaint  Patient presents with  . Back Pain   Patient is a 27 y.o. male presenting with back pain. The history is provided by the patient. No language interpreter was used.  Back Pain  HPI Comments: Christian Wright is a 27 y.o. male who presents to the Emergency Department complaining of right posterior rib pain that started three days ago.  Pt was seen here two days ago for the same.   Pt was told the back pain was related to cough but pt states the back pain persists after these cold symptoms have improved. He states the pain increases with movement, deep breaths, and coughing. He states it feels like a pinched nerve or a pulled muscle. He has been taking hydrocodone and tylenol extra strength with no relief. He denies recent travels, surgeries, LE edema, or calf pain.  No prior hx of DVT or PE.  Denies any chest pain, SOB, palpitations.  No recent fevers, sweats, or chills.  No urinary sx or hx of kidney stones.  Past Medical History  Diagnosis Date  . Asthma   . Hypertension    History reviewed. No pertinent past surgical history. Family History  Problem Relation Age of Onset  . Hypertension Father   . Cancer Father     colon   History  Substance Use Topics  . Smoking status: Current Every Day Smoker -- 0.50 packs/day    Types: Cigarettes  . Smokeless tobacco: Not on file  . Alcohol Use: 2.5 oz/week    5 drink(s) per week     Comment: occasional    Review of Systems  Respiratory: Positive for cough.   Musculoskeletal: Positive for back pain.  All other systems reviewed and are negative.    Allergies  Review of patient's allergies indicates no known allergies.  Home  Medications   Current Outpatient Rx  Name  Route  Sig  Dispense  Refill  . albuterol (PROVENTIL HFA;VENTOLIN HFA) 108 (90 BASE) MCG/ACT inhaler   Inhalation   Inhale 2 puffs into the lungs every 6 (six) hours as needed. For wheezing         . albuterol (PROVENTIL) (5 MG/ML) 0.5% nebulizer solution   Nebulization   Take 0.5 mLs (2.5 mg total) by nebulization every 6 (six) hours as needed for wheezing or shortness of breath.   20 mL   0   . amLODipine (NORVASC) 10 MG tablet   Oral   Take 1 tablet (10 mg total) by mouth daily.   30 tablet   0   . HYDROcodone-acetaminophen (NORCO/VICODIN) 5-325 MG per tablet   Oral   Take 0.5 tablets by mouth at bedtime as needed (cough).   6 tablet   0   . Multiple Vitamin (MULITIVITAMIN WITH MINERALS) TABS   Oral   Take 1 tablet by mouth daily.         . predniSONE (DELTASONE) 50 MG tablet   Oral   Take 1 tablet (50 mg total) by mouth daily.   5 tablet   0   . triamterene-hydrochlorothiazide (MAXZIDE) 75-50 MG per tablet   Oral   Take 1 tablet by mouth daily.   30  tablet   0    BP 167/105  Pulse 103  Temp(Src) 97.7 F (36.5 C) (Temporal)  Resp 19  Wt 243 lb (110.224 kg)  SpO2 94% Physical Exam  Nursing note and vitals reviewed. Constitutional: He is oriented to person, place, and time. He appears well-developed and well-nourished. No distress.  HENT:  Head: Normocephalic and atraumatic.  Mouth/Throat: Oropharynx is clear and moist.  Eyes: Conjunctivae and EOM are normal. Pupils are equal, round, and reactive to light.  Neck: Normal range of motion.  Cardiovascular: Normal rate, regular rhythm and normal heart sounds.   Pulmonary/Chest: Effort normal and breath sounds normal. No respiratory distress. He has no wheezes. He has no rales.  Lungs CTAB; right poster ribs mildly TTP  Abdominal: Soft. Bowel sounds are normal.  No CVA tenderness  Musculoskeletal: Normal range of motion.  Neurological: He is alert and oriented  to person, place, and time.  Skin: Skin is warm and dry. He is not diaphoretic.  Psychiatric: He has a normal mood and affect.    ED Course  Procedures (including critical care time) Medications - No data to display  DIAGNOSTIC STUDIES: Oxygen Saturation is 94% on RA, adequate by my interpretation.    COORDINATION OF CARE: 5:10 PM- Discussed treatment plan with pt. Pt agrees to plan.  Labs Review Labs Reviewed - No data to display Imaging Review Dg Chest 2 View  04/29/2013   CLINICAL DATA:  Right-sided chest pain. Cough and chest congestion. Shortness of breath.  EXAM: CHEST  2 VIEW  COMPARISON:  08/09/2010  FINDINGS: The heart size and mediastinal contours are within normal limits. Both lungs are clear. The visualized skeletal structures are unremarkable.  IMPRESSION: No active cardiopulmonary disease.   Electronically Signed   By: Geanie Cooley M.D.   On: 04/29/2013 19:30    EKG Interpretation   None       MDM   1. Rib pain on right side    Pt without current cold sx but with persist right posterior rib pain, worse with movement, coughing, and deep breath.  Likely costochondritis vs pleurisy.  I have low suspicion for ACS or PE at this time.  Pt will be started on prednisone taper and instructed to take OTC anti-inflammatories.  FU with [facility] if sx not improving in the next few days.  Discussed plan with pt, he agreed.  Return precautions advised.  I personally performed the services described in this documentation, which was scribed in my presence. The recorded information has been reviewed and is accurate.  Garlon Hatchet, PA-C 05/01/13 1725

## 2013-05-01 NOTE — ED Notes (Signed)
Pt reports being seen here recently for same. Having mid right side back pain and cold symptoms. Was told that back pain was related to cough and cold but now cold symptoms are improving but back still hurts, pain increases with movement. Ambulatory at triage, no acute distress noted.

## 2013-05-01 NOTE — ED Provider Notes (Signed)
History/physical exam/procedure(s) were performed by non-physician practitioner and as supervising physician I was immediately available for consultation/collaboration. I have reviewed all notes and am in agreement with care and plan.   Jairy Angulo S Amayiah Gosnell, MD 05/01/13 1416 

## 2013-05-02 ENCOUNTER — Emergency Department (HOSPITAL_COMMUNITY): Payer: Self-pay

## 2013-05-02 ENCOUNTER — Encounter (HOSPITAL_COMMUNITY): Payer: Self-pay | Admitting: Emergency Medicine

## 2013-05-02 ENCOUNTER — Emergency Department (HOSPITAL_COMMUNITY)
Admission: EM | Admit: 2013-05-02 | Discharge: 2013-05-02 | Disposition: A | Discharge status: Home / Self Care | Payer: Self-pay | Attending: Emergency Medicine | Admitting: Emergency Medicine

## 2013-05-02 DIAGNOSIS — X58XXXA Exposure to other specified factors, initial encounter: Secondary | ICD-10-CM | POA: Insufficient documentation

## 2013-05-02 DIAGNOSIS — F17200 Nicotine dependence, unspecified, uncomplicated: Secondary | ICD-10-CM | POA: Insufficient documentation

## 2013-05-02 DIAGNOSIS — S2239XA Fracture of one rib, unspecified side, initial encounter for closed fracture: Secondary | ICD-10-CM | POA: Insufficient documentation

## 2013-05-02 DIAGNOSIS — S2231XA Fracture of one rib, right side, initial encounter for closed fracture: Secondary | ICD-10-CM

## 2013-05-02 DIAGNOSIS — I10 Essential (primary) hypertension: Secondary | ICD-10-CM | POA: Insufficient documentation

## 2013-05-02 DIAGNOSIS — Y929 Unspecified place or not applicable: Secondary | ICD-10-CM | POA: Insufficient documentation

## 2013-05-02 DIAGNOSIS — Z79899 Other long term (current) drug therapy: Secondary | ICD-10-CM | POA: Insufficient documentation

## 2013-05-02 DIAGNOSIS — Y939 Activity, unspecified: Secondary | ICD-10-CM | POA: Insufficient documentation

## 2013-05-02 DIAGNOSIS — IMO0002 Reserved for concepts with insufficient information to code with codable children: Secondary | ICD-10-CM | POA: Insufficient documentation

## 2013-05-02 DIAGNOSIS — J45909 Unspecified asthma, uncomplicated: Secondary | ICD-10-CM | POA: Insufficient documentation

## 2013-05-02 LAB — CBC WITH DIFFERENTIAL/PLATELET
Basophils Absolute: 0 10*3/uL (ref 0.0–0.1)
HCT: 46.7 % (ref 39.0–52.0)
Lymphocytes Relative: 14 % (ref 12–46)
Lymphs Abs: 1.9 10*3/uL (ref 0.7–4.0)
Monocytes Absolute: 1.2 10*3/uL — ABNORMAL HIGH (ref 0.1–1.0)
Neutro Abs: 11.1 10*3/uL — ABNORMAL HIGH (ref 1.7–7.7)
Platelets: 286 10*3/uL (ref 150–400)
RBC: 5.84 MIL/uL — ABNORMAL HIGH (ref 4.22–5.81)
RDW: 13.9 % (ref 11.5–15.5)
WBC: 14.3 10*3/uL — ABNORMAL HIGH (ref 4.0–10.5)

## 2013-05-02 LAB — BASIC METABOLIC PANEL
CO2: 23 mEq/L (ref 19–32)
Chloride: 99 mEq/L (ref 96–112)
Glucose, Bld: 107 mg/dL — ABNORMAL HIGH (ref 70–99)
Sodium: 137 mEq/L (ref 135–145)

## 2013-05-02 LAB — TROPONIN I: Troponin I: <0.3 ng/mL (ref <0.30)

## 2013-05-02 MED ORDER — METHOCARBAMOL 500 MG PO TABS: 500.0000 mg | ORAL_TABLET | Freq: Two times a day (BID) | ORAL | Status: DC

## 2013-05-02 MED ORDER — IBUPROFEN 600 MG PO TABS: 600.0000 mg | ORAL_TABLET | Freq: Four times a day (QID) | ORAL | Status: DC | PRN

## 2013-05-02 MED ORDER — MORPHINE SULFATE 4 MG/ML IJ SOLN
4.0000 mg | Freq: Once | INTRAMUSCULAR | Status: AC
  Administered 2013-05-02: 4 mg via INTRAVENOUS
  Filled 2013-05-02: qty 1

## 2013-05-02 MED ORDER — ASPIRIN 81 MG PO CHEW
162.0000 mg | CHEWABLE_TABLET | Freq: Once | ORAL | Status: AC
  Administered 2013-05-02: 162 mg via ORAL
  Filled 2013-05-02: qty 2

## 2013-05-02 NOTE — Progress Notes (Signed)
Checked back w/pt and he said tests confirmed he had a fractured rib. Pt said he was just in a lot of pain but would be ok. Marjory Lies Chaplain  05/02/13 1000  Clinical Encounter Type  Visited With Patient

## 2013-05-02 NOTE — ED Notes (Signed)
Pt reports that he was seen here last night. Pt reports have sub-axillar pain x 3 days. Pt states that he was given oxycodone for his pain, and i hasn't been helping.

## 2013-05-02 NOTE — Progress Notes (Signed)
Pt was sitting up in bed when I arrived and talking. He described pain in his side and said he was waiting for test results. Pt's mother and aunt were also bedside. Pt was in ED with his dad who was in trauma C. Marjory Lies Chaplain

## 2013-05-02 NOTE — ED Provider Notes (Signed)
CSN: [id number]     Arrival date & time 05/02/13  0518 History   First MD Initiated Contact with Patient 05/02/13 0555     Chief Complaint  Patient presents with  . Chest Pain   (Consider location/radiation/quality/duration/timing/severity/associated sxs/prior Treatment) HPI Comments: Pt comes in with cc of chest pain. Pt is having right sided lower thoracic pain, worse with cough. The pain is sharp. No hx of same pain before. Hx of HTN, and cocaine abuse, last use was 2 days ago. Pt has no dyspnea, nausea, or diaphoresis. No hx of same sx before.   Patient is a 27 y.o. male presenting with chest pain. The history is provided by the patient.  Chest Pain Associated symptoms: no abdominal pain, no cough and no shortness of breath     Past Medical History  Diagnosis Date  . Asthma   . Hypertension    History reviewed. No pertinent past surgical history. Family History  Problem Relation Age of Onset  . Hypertension Father   . Cancer Father     colon   History  Substance Use Topics  . Smoking status: Current Every Day Smoker -- 0.50 packs/day    Types: Cigarettes  . Smokeless tobacco: Not on file  . Alcohol Use: 2.5 oz/week    5 drink(s) per week     Comment: occasional    Review of Systems  Constitutional: Negative for activity change and appetite change.  Respiratory: Negative for cough and shortness of breath.   Cardiovascular: Positive for chest pain.  Gastrointestinal: Negative for abdominal pain.  Genitourinary: Negative for dysuria.    Allergies  Review of patient's allergies indicates no known allergies.  Home Medications   Current Outpatient Rx  Name  Route  Sig  Dispense  Refill  . albuterol (PROVENTIL HFA;VENTOLIN HFA) 108 (90 BASE) MCG/ACT inhaler   Inhalation   Inhale 2 puffs into the lungs every 6 (six) hours as needed. For wheezing         . albuterol (PROVENTIL) (5 MG/ML) 0.5% nebulizer solution   Nebulization   Take 0.5 mLs (2.5 mg total) by  nebulization every 6 (six) hours as needed for wheezing or shortness of breath.   20 mL   0   . amLODipine (NORVASC) 10 MG tablet   Oral   Take 1 tablet (10 mg total) by mouth daily.   30 tablet   0   . HYDROcodone-acetaminophen (NORCO/VICODIN) 5-325 MG per tablet   Oral   Take 0.5 tablets by mouth every 6 (six) hours as needed for moderate pain.         . Multiple Vitamin (MULITIVITAMIN WITH MINERALS) TABS   Oral   Take 1 tablet by mouth daily.         . predniSONE (DELTASONE) 20 MG tablet   Oral   Take 2 tablets (40 mg total) by mouth daily. Take 40 mg by mouth daily for 3 days, then 20mg  by mouth daily for 3 days, then 10mg  daily for 3 days   12 tablet   0   . triamterene-hydrochlorothiazide (MAXZIDE) 75-50 MG per tablet   Oral   Take 1 tablet by mouth daily.   30 tablet   0    BP 169/90  Pulse 101  Temp(Src) 97.8 F (36.6 C) (Oral)  Ht 5\' 11"  (1.803 m)  Wt 243 lb (110.224 kg)  BMI 33.91 kg/m2  SpO2 97% Physical Exam  Nursing note and vitals reviewed. Constitutional: He is oriented to person,  place, and time. He appears well-developed.  HENT:  Head: Normocephalic and atraumatic.  Eyes: Conjunctivae and EOM are normal. Pupils are equal, round, and reactive to light.  Neck: Normal range of motion. Neck supple.  Cardiovascular: Normal rate and regular rhythm.   Pulmonary/Chest: Effort normal and breath sounds normal.  Reproducible chest pain.  Abdominal: Soft. Bowel sounds are normal. He exhibits no distension. There is no tenderness. There is no rebound and no guarding.  Neurological: He is alert and oriented to person, place, and time.  Skin: Skin is warm.    ED Course  Procedures (including critical care time) Labs Review Labs Reviewed  CBC WITH DIFFERENTIAL  BASIC METABOLIC PANEL  TROPONIN I   Imaging Review Dg Chest 2 View  05/02/2013   CLINICAL DATA:  Right lower anterior chest pain.  EXAM: CHEST  2 VIEW  COMPARISON:  04/29/2013  FINDINGS:  Acute lateral right 7th rib fracture. No effusion, pneumothorax, or lung contusion. Normal heart size.  IMPRESSION: 1. Acute lateral right 7th rib fracture. 2. No acute cardiopulmonary disease.   Electronically Signed   By: Tiburcio Pea M.D.   On: 05/02/2013 06:46    EKG Interpretation   None       MDM  No diagnosis found.  Pt comes in with cc of chest pain. Flank region, right sided, pleuritic. No hx of renal stones. Xray today shows acute fx. Pt states that he he might have injured himself when his father fell earlier. Has cocaine hx, and so troponin and ekg ordered as well - but this appears to be rib fracture related pain.   Derwood Kaplan, MD 05/02/13 [phone]

## 2015-04-01 IMAGING — CR DG CHEST 2V
2 series · 2 of 2 positions shown · non-contrast
Comparison: 08/09/2010

CLINICAL DATA: Right-sided chest pain. Cough and chest congestion.
Shortness of breath.

EXAM:
CHEST  2 VIEW

[w chest pa]
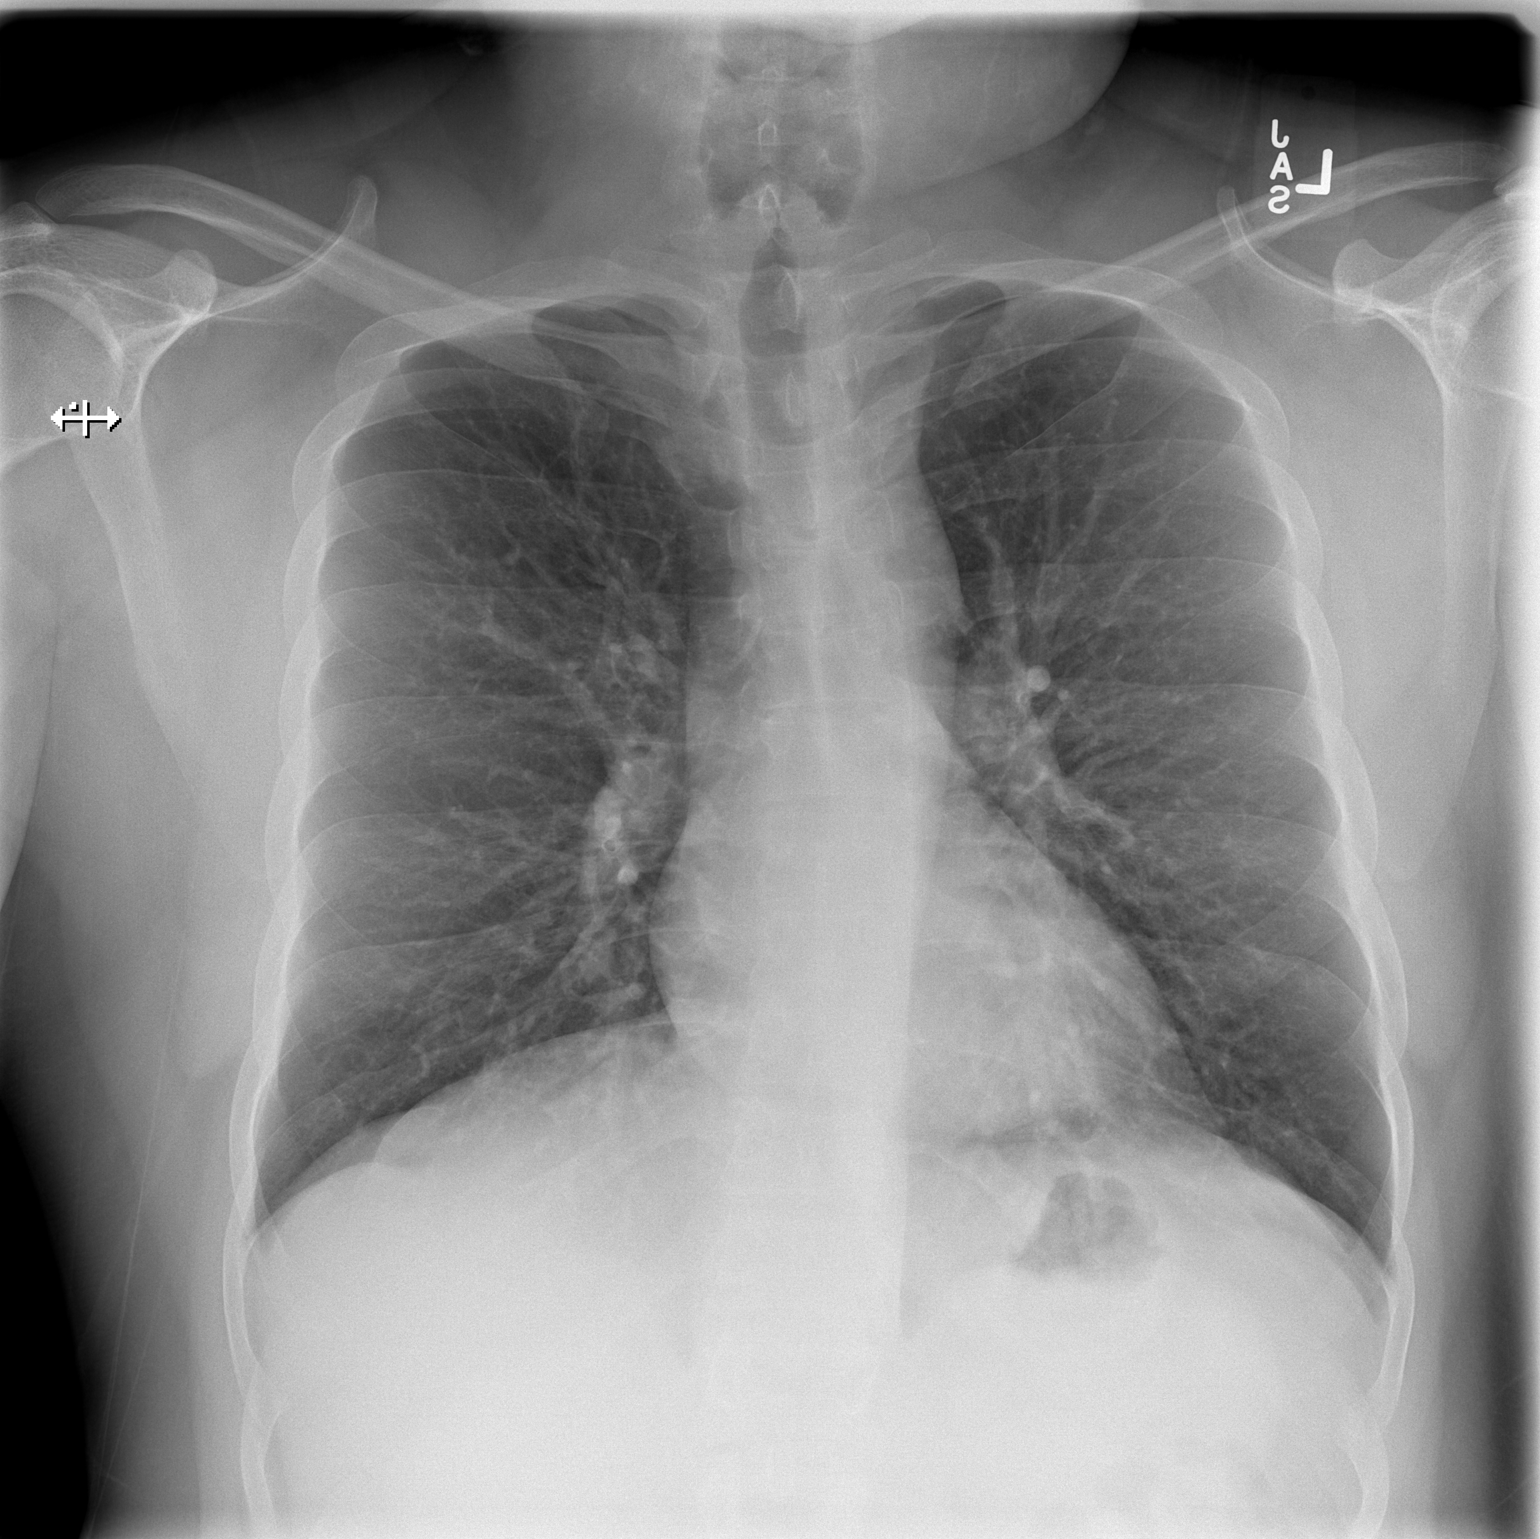

[w chest lat]
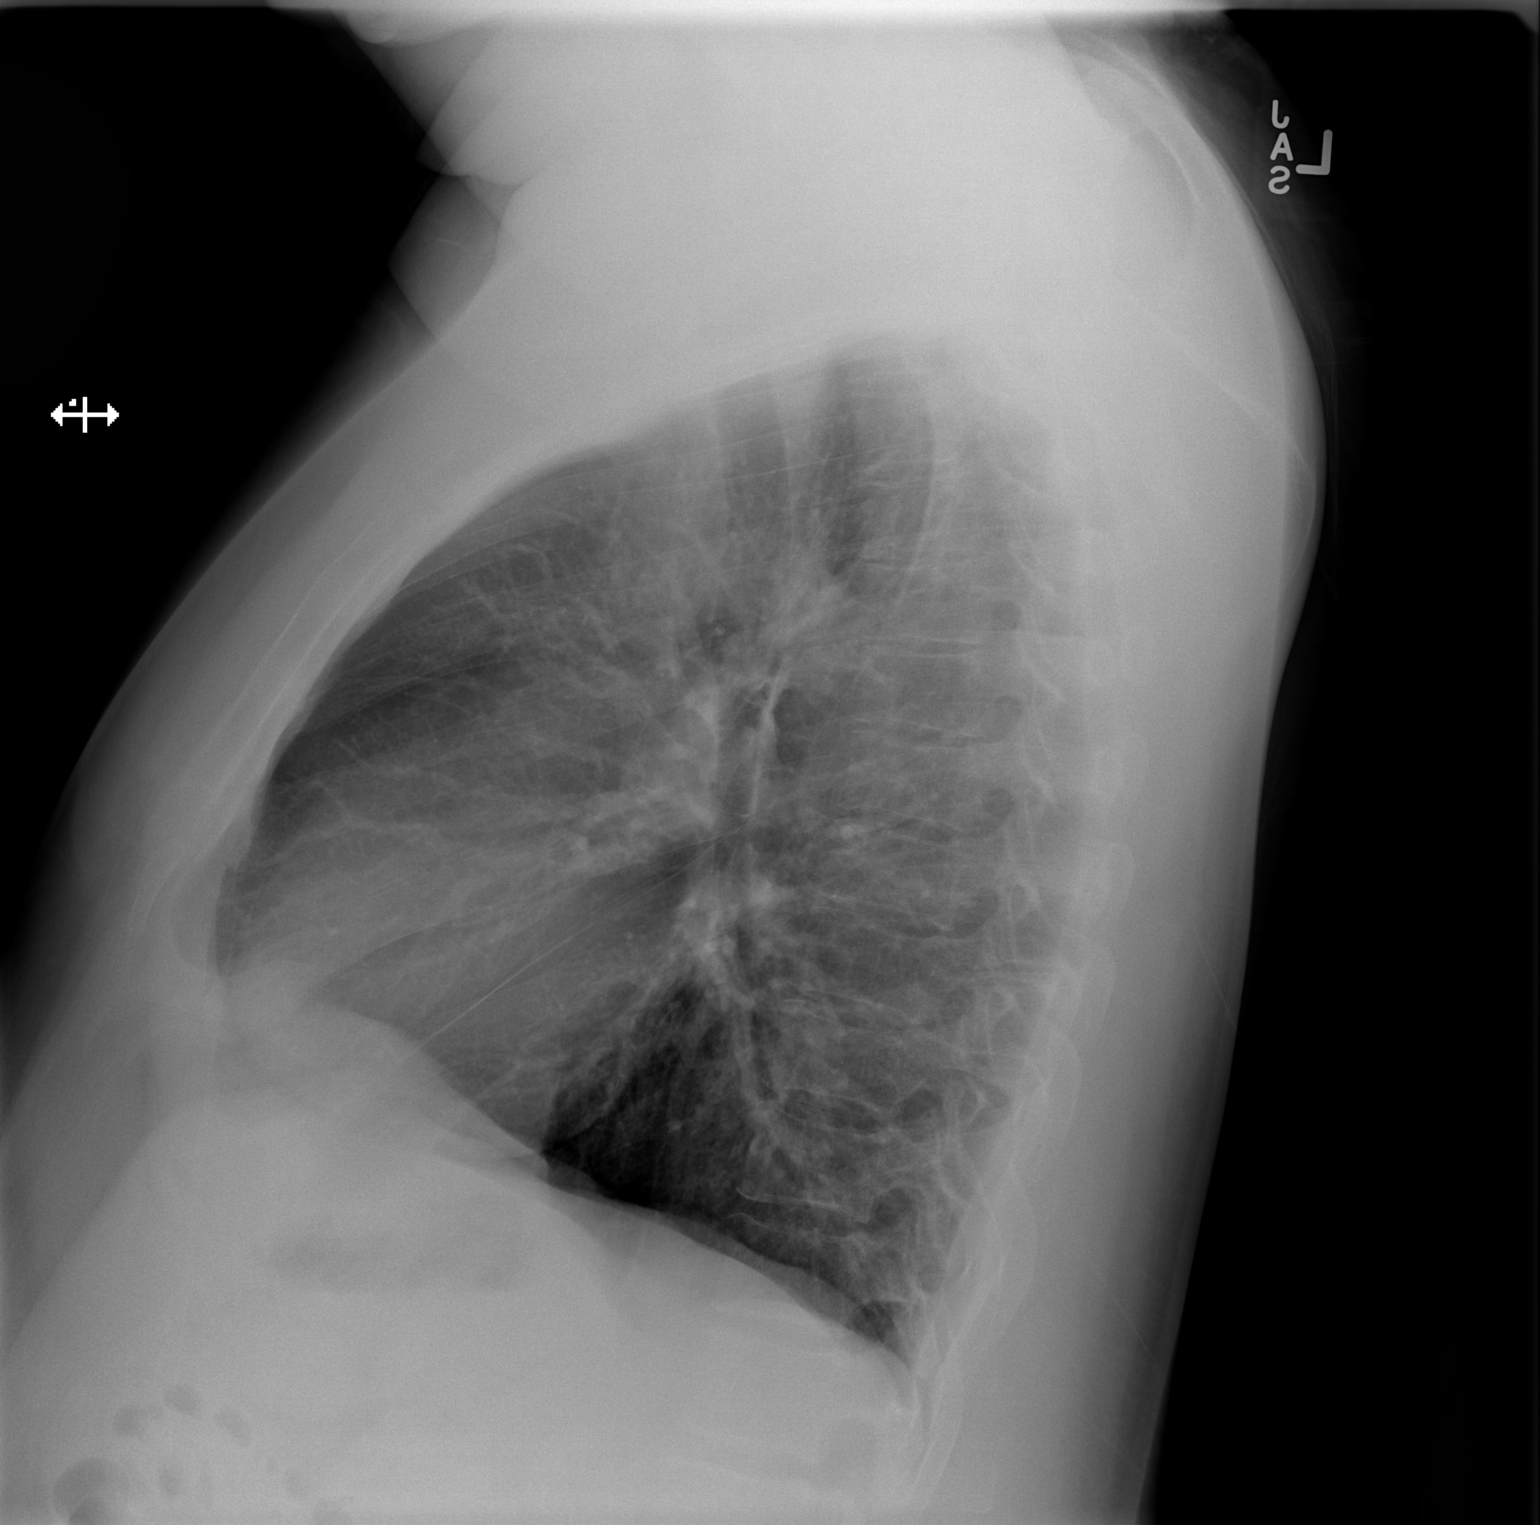

[2 of 2 positions shown; findings below may reference images not displayed]

FINDINGS: The heart size and mediastinal contours are within normal limits.
Both lungs are clear. The visualized skeletal structures are
unremarkable.
IMPRESSION: No active cardiopulmonary disease.

## 2016-08-05 ENCOUNTER — Encounter (HOSPITAL_COMMUNITY): Payer: Self-pay | Admitting: *Deleted

## 2016-08-05 ENCOUNTER — Emergency Department (HOSPITAL_COMMUNITY)
Admission: EM | Admit: 2016-08-05 | Discharge: 2016-08-06 | Disposition: A | Discharge status: Home / Self Care | Payer: Self-pay | Attending: Emergency Medicine | Admitting: Emergency Medicine

## 2016-08-05 DIAGNOSIS — Z79899 Other long term (current) drug therapy: Secondary | ICD-10-CM | POA: Insufficient documentation

## 2016-08-05 DIAGNOSIS — J45909 Unspecified asthma, uncomplicated: Secondary | ICD-10-CM | POA: Insufficient documentation

## 2016-08-05 DIAGNOSIS — I160 Hypertensive urgency: Secondary | ICD-10-CM | POA: Insufficient documentation

## 2016-08-05 DIAGNOSIS — F17210 Nicotine dependence, cigarettes, uncomplicated: Secondary | ICD-10-CM | POA: Insufficient documentation

## 2016-08-05 DIAGNOSIS — Z7982 Long term (current) use of aspirin: Secondary | ICD-10-CM | POA: Insufficient documentation

## 2016-08-05 LAB — I-STAT CHEM 8, ED
BUN: 11 mg/dL (ref 6–20)
CALCIUM ION: 1.08 mmol/L — AB (ref 1.15–1.40)
CHLORIDE: 105 mmol/L (ref 101–111)
Creatinine, Ser: 1.1 mg/dL (ref 0.61–1.24)
Glucose, Bld: 111 mg/dL — ABNORMAL HIGH (ref 65–99)
HCT: 52 % (ref 39.0–52.0)
Hemoglobin: 17.7 g/dL — ABNORMAL HIGH (ref 13.0–17.0)
POTASSIUM: 4.4 mmol/L (ref 3.5–5.1)
SODIUM: 140 mmol/L (ref 135–145)
TCO2: 24 mmol/L (ref 0–100)

## 2016-08-05 LAB — I-STAT TROPONIN, ED: Troponin i, poc: 0 ng/mL (ref 0.00–0.08)

## 2016-08-05 MED ORDER — AMLODIPINE BESYLATE 5 MG PO TABS
5.0000 mg | ORAL_TABLET | Freq: Once | ORAL | Status: AC
  Administered 2016-08-05: 5 mg via ORAL
  Filled 2016-08-05: qty 1

## 2016-08-05 NOTE — ED Provider Notes (Signed)
[facility]-EMERGENCY DEPT Provider Note   CSN: [id number] Arrival date & time: 08/05/16  2007     History   Chief Complaint Chief Complaint  Patient presents with  . Hypertension    HPI Christian Wright is a 31 y.o. male.  HPI  31 year old male presents with a chief complaint of hypertension. Patient states his body feels funny and that's how I can tell that his blood pressures up. He used to be on blood pressure medicine but it made him urinate too much and so he stopped. Patient denies headache or chest pain. He states over the last couple days he has had some left-sided neck pain that is worse with turning his neck and palpation. He thinks he slept on it wrong. Today, this afternoon, he had about 30 min of left shoulder/left trapezius aching however there was no numbness, extremity weakness, or chest pain/pressure/shortness of breath. No blurry vision. 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  Past Medical History:  Diagnosis Date  . Asthma   . Hypertension     There are no active problems to display for this patient.   History reviewed. No pertinent surgical history.     Home Medications    Prior to Admission medications   Medication Sig Start Date End Date Taking? Authorizing Provider  aspirin EC 81 MG tablet Take 162 mg by mouth daily.   Yes Historical Provider, MD  methocarbamol (ROBAXIN) 500 MG tablet Take 1 tablet (500 mg total) by mouth 2 (two) times daily. 05/02/13  Yes Derwood Kaplan, MD  albuterol (PROVENTIL HFA;VENTOLIN HFA) 108 (90 Base) MCG/ACT inhaler Inhale 2 puffs into the lungs every 4 (four) hours as needed for wheezing or shortness of breath. 08/06/16   Pricilla Loveless, MD  amLODipine (NORVASC) 10 MG tablet Take 1 tablet (10 mg total) by mouth daily. 08/06/16   Pricilla Loveless, MD    Family History Family History  Problem Relation Age of Onset  . Hypertension Father   . Cancer Father     colon    Social History Social History  Substance  Use Topics  . Smoking status: Current Every Day Smoker    Packs/day: 0.50    Types: Cigarettes  . Smokeless tobacco: Never Used  . Alcohol use 2.5 oz/week    5 Standard drinks or equivalent per week     Comment: occasional     Allergies   Patient has no known allergies.   Review of Systems Review of Systems  Respiratory: Negative for shortness of breath.   Cardiovascular: Negative for chest pain.  Gastrointestinal: Negative for vomiting.  Musculoskeletal: Positive for neck pain.  Neurological: Negative for weakness, numbness and headaches.  All other systems reviewed and are negative.    Physical Exam Updated Vital Signs BP (!) 166/105   Pulse 88   Temp 98.6 F (37 C) (Oral)   Resp 14   Ht 5\' 11"  (1.803 m)   Wt 245 lb (111.1 kg)   SpO2 97%   BMI 34.17 kg/m   Physical Exam  Constitutional: He is oriented to person, place, and time. He appears well-developed and well-nourished. No distress.  HENT:  Head: Normocephalic and atraumatic.  Right Ear: External ear normal.  Left Ear: External ear normal.  Nose: Nose normal.  Eyes: EOM are normal. Right eye exhibits no discharge. Left eye exhibits no discharge.  Left eye with chronic changes  Neck: Normal range of motion. Neck supple. No spinous process tenderness and no  muscular tenderness present.  Cardiovascular: Normal rate, regular rhythm and normal heart sounds.   Pulmonary/Chest: Effort normal and breath sounds normal.  Abdominal: Soft. There is no tenderness.  Musculoskeletal: He exhibits no edema.       Left shoulder: He exhibits normal range of motion, no tenderness and no bony tenderness.  Neurological: He is alert and oriented to person, place, and time.  CN 3-12 grossly intact. 5/5 strength in all 4 extremities. Grossly normal sensation. Normal finger to nose.   Skin: Skin is warm and dry. He is not diaphoretic.  Nursing note and vitals reviewed.    ED Treatments / Results  Labs (all labs ordered are  listed, but only abnormal results are displayed) Labs Reviewed  I-STAT CHEM 8, ED - Abnormal; Notable for the following:       Result Value   Glucose, Bld 111 (*)    Calcium, Ion 1.08 (*)    Hemoglobin 17.7 (*)    All other components within normal limits  I-STAT TROPOININ, ED  I-STAT TROPOININ, ED    EKG  EKG Interpretation  Date/Time:  Thursday August 05 2016 21:32:33 EST Ventricular Rate:  89 PR Interval:    QRS Duration: 92 QT Interval:  339 QTC Calculation: 413 R Axis:   42 Text Interpretation:  Sinus rhythm Anteroseptal infarct, old Nonspecific T abnormalities, lateral leads T wave changes slightly worse than 2012, likely LVH Confirmed by Bessye Stith MD, Zahraa Bhargava [phone]([id number]) on 08/05/2016 10:01:41 PM       Radiology No results found.  Procedures Procedures (including critical care time)  Medications Ordered in ED Medications  amLODipine (NORVASC) tablet 5 mg (5 mg Oral Given 08/05/16 2349)     Initial Impression / Assessment and Plan / ED Course  I have reviewed the triage vital signs and the nursing notes.  Pertinent labs & imaging results that were available during my care of the patient were reviewed by me and considered in my medical decision making (see chart for details).  Clinical Course as of Aug 07 135  Thu Aug 05, 2016  2117 Given that the shoulder/neck pain was a throbbing and reproducible, ACS or acute stroke/TIA is unlikely. However given the degree of hypertension, will get labs to evaluate electrode light and creatinine as well as a troponin and ECG.  [SG]  Fri Aug 06, 2016  0134 Second troponin is negative. His ECG has ST/T changes that are slightly progressive compared to prior but I think this is LVH related. I have stressed the importance of better hypertension control and PCP follow-up. Follow-up precautions given. I've low suspicion that his transient, atypical shoulder pain was ACS or neurologic.  [SG]    Clinical Course User Index [SG] Pricilla LovelessScott  Alanis Clift, MD    Final Clinical Impressions(s) / ED Diagnoses   Final diagnoses:  Hypertensive urgency    New Prescriptions New Prescriptions   ALBUTEROL (PROVENTIL HFA;VENTOLIN HFA) 108 (90 BASE) MCG/ACT INHALER    Inhale 2 puffs into the lungs every 4 (four) hours as needed for wheezing or shortness of breath.   AMLODIPINE (NORVASC) 10 MG TABLET    Take 1 tablet (10 mg total) by mouth daily.     Pricilla LovelessScott Damari Hiltz, MD 08/06/16 [phone]

## 2016-08-05 NOTE — ED Triage Notes (Signed)
Pt reports he feels like his BP is high.  Pt reports he has anxiety and is under a lot of stress recently.  Pt denies blurry vision or headache.  Pt used to take BP medications years ago but stopped because he did not like the way the medication affected him. Pt reports some neck pain that goes down his arm but pt believes he slept on it wrong.  Pt a/o x 4 and ambulatory.

## 2016-08-06 LAB — I-STAT TROPONIN, ED: Troponin i, poc: 0 ng/mL (ref 0.00–0.08)

## 2016-08-06 MED ORDER — AMLODIPINE BESYLATE 10 MG PO TABS: 10.0000 mg | ORAL_TABLET | Freq: Every day | ORAL | 1 refills | Status: DC

## 2016-08-06 MED ORDER — ALBUTEROL SULFATE HFA 108 (90 BASE) MCG/ACT IN AERS: 2.0000 | INHALATION_SPRAY | RESPIRATORY_TRACT | 1 refills | Status: DC | PRN

## 2019-01-01 ENCOUNTER — Encounter (HOSPITAL_COMMUNITY): Payer: Self-pay | Admitting: Emergency Medicine

## 2019-01-01 ENCOUNTER — Ambulatory Visit (HOSPITAL_COMMUNITY)
Admission: EM | Admit: 2019-01-01 | Discharge: 2019-01-01 | Disposition: A | Discharge status: Home / Self Care | Payer: Self-pay | Attending: Family Medicine | Admitting: Family Medicine

## 2019-01-01 ENCOUNTER — Other Ambulatory Visit: Payer: Self-pay

## 2019-01-01 DIAGNOSIS — Z9114 Patient's other noncompliance with medication regimen: Secondary | ICD-10-CM

## 2019-01-01 DIAGNOSIS — I10 Essential (primary) hypertension: Secondary | ICD-10-CM

## 2019-01-01 DIAGNOSIS — Z202 Contact with and (suspected) exposure to infections with a predominantly sexual mode of transmission: Secondary | ICD-10-CM | POA: Insufficient documentation

## 2019-01-01 MED ORDER — METRONIDAZOLE 500 MG PO TABS: 2000.0000 mg | ORAL_TABLET | Freq: Once | ORAL | 0 refills | Status: AC

## 2019-01-01 MED ORDER — HYDROCHLOROTHIAZIDE 12.5 MG PO TABS: 12.5000 mg | ORAL_TABLET | Freq: Every day | ORAL | 0 refills | Status: DC

## 2019-01-01 MED ORDER — METRONIDAZOLE 500 MG PO TABS
ORAL_TABLET | ORAL | Status: AC
  Filled 2019-01-01: qty 1

## 2019-01-01 MED ORDER — METRONIDAZOLE 500 MG PO TABS: 2000.0000 mg | ORAL_TABLET | Freq: Once | ORAL | Status: DC

## 2019-01-01 NOTE — Discharge Instructions (Addendum)
We have treated you today for trichimonas, with metronidazole. Please refrain from sexual activity for 7 days while medicine is clearing infection.  We are testing you for Gonorrhea, Chlamydia and Trichomonas. We will call you if anything is positive and let you know if you require any further treatment. Please inform partner of any positive results.  Please return if symptoms not improving with treatment, development of fever, nausea, vomiting, abdominal pain, scrotal pain.  Your blood pressure was elevated today in clinic. Please be sure to take blood pressure medications as prescribed. Please monitor your blood pressure at home or when you go to a [facility]. Please follow up with your primary care doctor to recheck blood pressure and discuss any need for medication changes.   Please go to Emergency Room if you start to experience severe headache, vision changes, decreased urine production, chest pain, shortness of breath, speech slurring, one sided weakness.

## 2019-01-01 NOTE — ED Provider Notes (Signed)
[facility]    CSN: [id number] Arrival date & time: 01/01/19  1858      History   Chief Complaint Chief Complaint  Patient presents with  . Exposure to STD    HPI Christian Wright is a 33 y.o. male history of asthma, hypertension, presenting today for evaluation of STD screening after exposure.  Patient states that his partner recently tested positive for trichomonas.  He has noticed some occasional tingling sensation with urination, but denies dysuria or penile discharge.  Denies rashes or lesions.  He has felt his semen has been slightly darker than normal.  Denies hematuria.  Denies abdominal pain.  Intercourse only with females.  Denies male male intercourse.  HPI  Past Medical History:  Diagnosis Date  . Asthma   . Hypertension     There are no active problems to display for this patient.   History reviewed. No pertinent surgical history.     Home Medications    Prior to Admission medications   Medication Sig Start Date End Date Taking? Authorizing Provider  albuterol (PROVENTIL HFA;VENTOLIN HFA) 108 (90 Base) MCG/ACT inhaler Inhale 2 puffs into the lungs every 4 (four) hours as needed for wheezing or shortness of breath. 08/06/16   Sherwood Gambler, MD  amLODipine (NORVASC) 10 MG tablet Take 1 tablet (10 mg total) by mouth daily. 08/06/16   Sherwood Gambler, MD  aspirin EC 81 MG tablet Take 162 mg by mouth daily.    [provider]  hydrochlorothiazide (HYDRODIURIL) 12.5 MG tablet Take 1 tablet (12.5 mg total) by mouth daily. 01/01/19   Nil Xiong C, PA-C  methocarbamol (ROBAXIN) 500 MG tablet Take 1 tablet (500 mg total) by mouth 2 (two) times daily. 05/02/13   Varney Biles, MD  metroNIDAZOLE (FLAGYL) 500 MG tablet Take 4 tablets (2,000 mg total) by mouth once for 1 dose. 01/01/19 01/01/19  Christain Mcraney, Elesa Hacker, PA-C    Family History Family History  Problem Relation Age of Onset  . Hypertension Father   . Cancer Father        colon    Social History Social History   Tobacco Use  . Smoking status: Current Every Day Smoker    Packs/day: 0.50    Types: Cigarettes  . Smokeless tobacco: Never Used  Substance Use Topics  . Alcohol use: Yes    Alcohol/week: 5.0 standard drinks    Types: 5 Standard drinks or equivalent per week    Comment: occasional  . Drug use: Yes    Frequency: 3.0 times per week    Types: Marijuana     Allergies   Patient has no known allergies.   Review of Systems Review of Systems  Constitutional: Negative for fever.  HENT: Negative for sore throat.   Respiratory: Negative for shortness of breath.   Cardiovascular: Negative for chest pain.  Gastrointestinal: Negative for abdominal pain, nausea and vomiting.  Genitourinary: Negative for difficulty urinating, discharge, dysuria, frequency, penile pain, penile swelling, scrotal swelling and testicular pain.  Skin: Negative for rash.  Neurological: Negative for dizziness, light-headedness and headaches.     Physical Exam Triage Vital Signs ED Triage Vitals [01/01/19 1934]  Enc Vitals Group     BP (!) 199/101     Pulse Rate (!) 101     Resp 18     Temp 98.3 F (36.8 C)     Temp Source Oral     SpO2 99 %     Weight  Height      Head Circumference      Peak Flow      Pain Score 0     Pain Loc      Pain Edu?      Excl. in GC?    No data found.  Updated Vital Signs BP (!) 199/101 (BP Location: Right Arm)   Pulse (!) 101   Temp 98.3 F (36.8 C) (Oral)   Resp 18   SpO2 99%   Visual Acuity Right Eye Distance:   Left Eye Distance:   Bilateral Distance:    Right Eye Near:   Left Eye Near:    Bilateral Near:     Physical Exam Vitals signs and nursing note reviewed.  Constitutional:      Appearance: He is well-developed.     Comments: No acute distress  HENT:     Head: Normocephalic and atraumatic.     Nose: Nose normal.  Eyes:     Conjunctiva/sclera: Conjunctivae normal.  Neck:     Musculoskeletal: Neck  supple.  Cardiovascular:     Rate and Rhythm: Normal rate.  Pulmonary:     Effort: Pulmonary effort is normal. No respiratory distress.  Abdominal:     General: There is no distension.     Comments: Soft, nondistended, nontender light deep palpation throughout entire abdomen  Musculoskeletal: Normal range of motion.  Skin:    General: Skin is warm and dry.  Neurological:     Mental Status: He is alert and oriented to person, place, and time.      UC Treatments / Results  Labs (all labs ordered are listed, but only abnormal results are displayed) Labs Reviewed  URINE CYTOLOGY ANCILLARY ONLY    EKG   Radiology No results found.  Procedures Procedures (including critical care time)  Medications Ordered in UC Medications  metroNIDAZOLE (FLAGYL) tablet 2,000 mg (has no administration in time range)  metroNIDAZOLE (FLAGYL) 500 MG tablet (has no administration in time range)    Initial Impression / Assessment and Plan / UC Course  I have reviewed the triage vital signs and the nursing notes.  Pertinent labs & imaging results that were available during my care of the patient were reviewed by me and considered in my medical decision making (see chart for details).     Patient with exposure to trichomonas.  Will treat with 2 g of Flagyl empirically.  Urine cytology obtained to check for gonorrhea, chlamydia and trichomonas.  HIV and syphilis testing deferred.  Discussed elevated blood pressure- has not taken medicine for a while.  Was previously on HCTZ, will refill this and have patient follow-up with primary care for further management.Discussed strict return precautions. Patient verbalized understanding and is agreeable with plan.  Final Clinical Impressions(s) / UC Diagnoses   Final diagnoses:  Exposure to STD     Discharge Instructions     We have treated you today for trichimonas, with metronidazole. Please refrain from sexual activity for 7 days while medicine is  clearing infection.  We are testing you for Gonorrhea, Chlamydia and Trichomonas. We will call you if anything is positive and let you know if you require any further treatment. Please inform partner of any positive results.  Please return if symptoms not improving with treatment, development of fever, nausea, vomiting, abdominal pain, scrotal pain.  Your blood pressure was elevated today in clinic. Please be sure to take blood pressure medications as prescribed. Please monitor your blood pressure at home or  when you go to a [facility]. Please follow up with your primary care doctor to recheck blood pressure and discuss any need for medication changes.   Please go to Emergency Room if you start to experience severe headache, vision changes, decreased urine production, chest pain, shortness of breath, speech slurring, one sided weakness.    ED Prescriptions    Medication Sig Dispense Auth. Provider   hydrochlorothiazide (HYDRODIURIL) 12.5 MG tablet Take 1 tablet (12.5 mg total) by mouth daily. 30 tablet Desani Sprung C, PA-C   metroNIDAZOLE (FLAGYL) 500 MG tablet Take 4 tablets (2,000 mg total) by mouth once for 1 dose. 4 tablet Cody Oliger C, PA-C     Controlled Substance Prescriptions [place] Controlled Substance Registry consulted? Not Applicable   Lew DawesWieters, Ashtyn Freilich C, [place]PA-C 01/01/19 2004

## 2019-01-01 NOTE — ED Triage Notes (Signed)
Pt here for exposure to STD; denies complaint

## 2019-01-02 LAB — URINE CYTOLOGY ANCILLARY ONLY
Chlamydia: NEGATIVE
Neisseria Gonorrhea: NEGATIVE
Trichomonas: NEGATIVE

## 2019-03-29 ENCOUNTER — Other Ambulatory Visit: Payer: Self-pay

## 2019-03-29 ENCOUNTER — Emergency Department (HOSPITAL_COMMUNITY)
Admission: EM | Admit: 2019-03-29 | Discharge: 2019-03-29 | Disposition: A | Discharge status: Home / Self Care | Payer: Self-pay | Attending: Emergency Medicine | Admitting: Emergency Medicine

## 2019-03-29 ENCOUNTER — Ambulatory Visit (HOSPITAL_COMMUNITY)
Admission: EM | Admit: 2019-03-29 | Discharge: 2019-03-29 | Disposition: A | Discharge status: Home / Self Care | Payer: Self-pay

## 2019-03-29 ENCOUNTER — Encounter (HOSPITAL_COMMUNITY): Payer: Self-pay

## 2019-03-29 DIAGNOSIS — I160 Hypertensive urgency: Secondary | ICD-10-CM | POA: Insufficient documentation

## 2019-03-29 DIAGNOSIS — F17210 Nicotine dependence, cigarettes, uncomplicated: Secondary | ICD-10-CM | POA: Insufficient documentation

## 2019-03-29 DIAGNOSIS — J45909 Unspecified asthma, uncomplicated: Secondary | ICD-10-CM | POA: Insufficient documentation

## 2019-03-29 DIAGNOSIS — Z79899 Other long term (current) drug therapy: Secondary | ICD-10-CM | POA: Insufficient documentation

## 2019-03-29 DIAGNOSIS — Z7982 Long term (current) use of aspirin: Secondary | ICD-10-CM | POA: Insufficient documentation

## 2019-03-29 MED ORDER — PREDNISONE 20 MG PO TABS
60.0000 mg | ORAL_TABLET | Freq: Once | ORAL | Status: AC
  Administered 2019-03-29: 14:00:00 60 mg via ORAL
  Filled 2019-03-29: qty 3

## 2019-03-29 MED ORDER — AMLODIPINE BESYLATE 10 MG PO TABS: 10.0000 mg | ORAL_TABLET | Freq: Every day | ORAL | 0 refills | Status: AC

## 2019-03-29 MED ORDER — AMLODIPINE BESYLATE 5 MG PO TABS
10.0000 mg | ORAL_TABLET | Freq: Once | ORAL | Status: AC
  Administered 2019-03-29: 13:00:00 10 mg via ORAL
  Filled 2019-03-29: qty 2

## 2019-03-29 MED ORDER — HYDROCHLOROTHIAZIDE 12.5 MG PO CAPS
12.5000 mg | ORAL_CAPSULE | Freq: Every day | ORAL | Status: DC
  Administered 2019-03-29: 13:00:00 12.5 mg via ORAL
  Filled 2019-03-29: qty 1

## 2019-03-29 MED ORDER — HYDROCHLOROTHIAZIDE 12.5 MG PO TABS: 12.5000 mg | ORAL_TABLET | Freq: Every day | ORAL | 0 refills | Status: AC

## 2019-03-29 NOTE — ED Provider Notes (Signed)
[facility]    CSN: [id number] Arrival date & time: 03/29/19  5284      History   Chief Complaint Chief Complaint  Patient presents with  . coughing    HPI Christian Wright is a 33 y.o. male.   Patient presents with 2-day history of headache and cough productive of clear phlegm.  He states it feels like an asthma exacerbation.  He declines Covid test.  Medical history is significant for asthma and hypertension.  Patient states he has not been taking his amlodipine because he is out of it.  He is currently taking his HCTZ.  He denies weakness, facial asymmetry, numbness, slurred speech, chest pain, shortness of breath, or other symptoms.  The history is provided by the patient.    Past Medical History:  Diagnosis Date  . Asthma   . Hypertension     There are no active problems to display for this patient.   History reviewed. No pertinent surgical history.     Home Medications    Prior to Admission medications   Medication Sig Start Date End Date Taking? Authorizing Provider  guaiFENesin (MUCINEX) 600 MG 12 hr tablet Take by mouth 2 (two) times daily.   Yes [provider]  albuterol (PROVENTIL HFA;VENTOLIN HFA) 108 (90 Base) MCG/ACT inhaler Inhale 2 puffs into the lungs every 4 (four) hours as needed for wheezing or shortness of breath. 08/06/16   Pricilla Loveless, MD  amLODipine (NORVASC) 10 MG tablet Take 1 tablet (10 mg total) by mouth daily. 08/06/16   Pricilla Loveless, MD  aspirin EC 81 MG tablet Take 162 mg by mouth daily.    [provider]  hydrochlorothiazide (HYDRODIURIL) 12.5 MG tablet Take 1 tablet (12.5 mg total) by mouth daily. 01/01/19   Wieters, Hallie C, PA-C  methocarbamol (ROBAXIN) 500 MG tablet Take 1 tablet (500 mg total) by mouth 2 (two) times daily. 05/02/13   Derwood Kaplan, MD    Family History Family History  Problem Relation Age of Onset  . Healthy Mother   . Hypertension Father   . Cancer Father    colon    Social History Social History   Tobacco Use  . Smoking status: Current Every Day Smoker    Packs/day: 0.50    Types: Cigarettes  . Smokeless tobacco: Never Used  Substance Use Topics  . Alcohol use: Yes    Alcohol/week: 5.0 standard drinks    Types: 5 Standard drinks or equivalent per week    Comment: occasional  . Drug use: Yes    Frequency: 1.0 times per week    Types: Marijuana     Allergies   Patient has no known allergies.   Review of Systems Review of Systems  Constitutional: Negative for chills and fever.  HENT: Negative for ear pain and sore throat.   Eyes: Negative for pain and visual disturbance.  Respiratory: Positive for cough. Negative for shortness of breath.   Cardiovascular: Negative for chest pain and palpitations.  Gastrointestinal: Negative for abdominal pain and vomiting.  Genitourinary: Negative for dysuria and hematuria.  Musculoskeletal: Negative for arthralgias and back pain.  Skin: Negative for color change and rash.  Neurological: Positive for headaches. Negative for dizziness, tremors, seizures, syncope, facial asymmetry, speech difficulty, weakness, light-headedness and numbness.  All other systems reviewed and are negative.    Physical Exam Triage Vital Signs ED Triage Vitals  Enc Vitals Group     BP 03/29/19 0937 (!) 209/119  Pulse Rate 03/29/19 0937 97     Resp 03/29/19 0937 18     Temp 03/29/19 0937 98 F (36.7 C)     Temp Source 03/29/19 0937 Temporal     SpO2 03/29/19 0937 96 %     Weight --      Height --      Head Circumference --      Peak Flow --      Pain Score 03/29/19 0945 0     Pain Loc --      Pain Edu? --      Excl. in GC? --    No data found.  Updated Vital Signs BP (!) 195/138 (BP Location: Right Arm)   Pulse (!) 103   Temp 98 F (36.7 C) (Temporal)   Resp 18   SpO2 96%   Visual Acuity Right Eye Distance:   Left Eye Distance:   Bilateral Distance:    Right Eye Near:   Left Eye Near:     Bilateral Near:     Physical Exam Vitals signs and nursing note reviewed.  Constitutional:      Appearance: He is well-developed.  HENT:     Head: Normocephalic and atraumatic.     Mouth/Throat:     Mouth: Mucous membranes are moist.     Pharynx: Oropharynx is clear.  Eyes:     Conjunctiva/sclera: Conjunctivae normal.  Neck:     Musculoskeletal: Neck supple.  Cardiovascular:     Rate and Rhythm: Normal rate and regular rhythm.     Heart sounds: No murmur.  Pulmonary:     Effort: Pulmonary effort is normal. No respiratory distress.     Breath sounds: Wheezing present.     Comments: Faint scattered expiratory wheezes.  Abdominal:     Palpations: Abdomen is soft.     Tenderness: There is no abdominal tenderness. There is no guarding or rebound.  Skin:    General: Skin is warm and dry.  Neurological:     General: No focal deficit present.     Mental Status: He is alert and oriented to person, place, and time.     Cranial Nerves: No cranial nerve deficit.     Sensory: No sensory deficit.     Motor: No weakness.     Coordination: Coordination normal.     Gait: Gait normal.     Deep Tendon Reflexes: Reflexes normal.  Psychiatric:        Mood and Affect: Mood normal.        Behavior: Behavior normal.      UC Treatments / Results  Labs (all labs ordered are listed, but only abnormal results are displayed) Labs Reviewed - No data to display  EKG   Radiology No results found.  Procedures Procedures (including critical care time)  Medications Ordered in UC Medications - No data to display  Initial Impression / Assessment and Plan / UC Course  I have reviewed the triage vital signs and the nursing notes.  Pertinent labs & imaging results that were available during my care of the patient were reviewed by me and considered in my medical decision making (see chart for details).    Hypertensive urgency.  Patient's blood pressure is 209/119, repeated 195/138.   Patient has been experiencing headaches.  Sending him to the emergency department for evaluation.     Final Clinical Impressions(s) / UC Diagnoses   Final diagnoses:  Hypertensive urgency     Discharge Instructions  Your blood pressure is too high to send you home.  Go to the emergency department for evaluation.    209/119, 195/138    ED Prescriptions    None     PDMP not reviewed this encounter.   Sharion Balloon, NP 03/29/19 1041

## 2019-03-29 NOTE — ED Provider Notes (Signed)
[facility]   CSN: [id number] Arrival date & time: 03/29/19  1103     History   Chief Complaint Chief Complaint  Patient presents with  . Hypertension    HPI Christian Wright is a 33 y.o. male.     The history is provided by the patient and medical records. No language interpreter was used.  Hypertension     33 year old male with history of hypertension, asthma sent here from urgent care center for further management of blood pressure.  Patient report for the past 2 days he has had headache, productive cough with clear phlegm, decrease shortness of breath that felt like an asthma exacerbation.  Does endorse increased wheezing and does admits to increasing use of his rescue inhaler every 4 hours which provide relief.  He does endorse a mild head congestion but denies any significant fever, body aches, loss of taste or smell, nausea vomiting diarrhea, pleuritic chest pain or abdominal pain.  He was initially seen at urgent care center today for his complaint but was noted to have systolic blood pressure in the 200s and was sent here for further evaluation.  Patient states that he has not been taking blood pressure medication for nearly a year because he does not have a PCP and he ran out of his medication.  He attributed his high blood pressure due to being nervous.  He does admits to taking TheraFlu, Mucinex, and Tylenol at home for his symptoms.  He refused cover testing and states he has not been around anyone that is sick.  He felt this is his usual asthma exacerbation due to weather changes.  Past Medical History:  Diagnosis Date  . Asthma   . Hypertension     There are no active problems to display for this patient.   History reviewed. No pertinent surgical history.      Home Medications    Prior to Admission medications   Medication Sig Start Date End Date Taking? Authorizing Provider  albuterol (PROVENTIL  HFA;VENTOLIN HFA) 108 (90 Base) MCG/ACT inhaler Inhale 2 puffs into the lungs every 4 (four) hours as needed for wheezing or shortness of breath. 08/06/16   Pricilla LovelessGoldston, Scott, MD  amLODipine (NORVASC) 10 MG tablet Take 1 tablet (10 mg total) by mouth daily. 08/06/16   Pricilla LovelessGoldston, Scott, MD  aspirin EC 81 MG tablet Take 162 mg by mouth daily.    [provider]  guaiFENesin (MUCINEX) 600 MG 12 hr tablet Take by mouth 2 (two) times daily.    [provider]  hydrochlorothiazide (HYDRODIURIL) 12.5 MG tablet Take 1 tablet (12.5 mg total) by mouth daily. 01/01/19   Wieters, Hallie C, PA-C  methocarbamol (ROBAXIN) 500 MG tablet Take 1 tablet (500 mg total) by mouth 2 (two) times daily. 05/02/13   Derwood KaplanNanavati, Ankit, MD    Family History Family History  Problem Relation Age of Onset  . Healthy Mother   . Hypertension Father   . Cancer Father        colon    Social History Social History   Tobacco Use  . Smoking status: Current Every Day Smoker    Packs/day: 0.50    Types: Cigarettes  . Smokeless tobacco: Never Used  Substance Use Topics  . Alcohol use: Yes    Alcohol/week: 5.0 standard drinks    Types: 5 Standard drinks or equivalent per week    Comment: occasional  . Drug use: Yes    Frequency:  1.0 times per week    Types: Marijuana     Allergies   Patient has no known allergies.   Review of Systems Review of Systems  All other systems reviewed and are negative.    Physical Exam Updated Vital Signs BP (!) 183/123 (BP Location: Right Arm)   Pulse (!) 104   Temp 98.9 F (37.2 C) (Oral)   Resp 18   Ht 5\' 11"  (1.803 m)   Wt 106.1 kg   SpO2 98%   BMI 32.64 kg/m   Physical Exam Vitals signs and nursing note reviewed.  Constitutional:      General: He is not in acute distress.    Appearance: He is well-developed.  HENT:     Head: Atraumatic.  Eyes:     Conjunctiva/sclera: Conjunctivae normal.  Neck:     Musculoskeletal: Neck supple.  Cardiovascular:      Rate and Rhythm: Tachycardia present.  Pulmonary:     Breath sounds: Wheezing (Faint expiratory wheezes no rales rhonchi) present.  Abdominal:     Palpations: Abdomen is soft.     Tenderness: There is no abdominal tenderness.  Musculoskeletal:        General: No swelling.  Skin:    Capillary Refill: Capillary refill takes less than 2 seconds.     Findings: No rash.  Neurological:     Mental Status: He is alert and oriented to person, place, and time.  Psychiatric:        Mood and Affect: Mood normal.      ED Treatments / Results  Labs (all labs ordered are listed, but only abnormal results are displayed) Labs Reviewed - No data to display  EKG None  Radiology No results found.  Procedures Procedures (including critical care time)  Medications Ordered in ED Medications - No data to display   Initial Impression / Assessment and Plan / ED Course  I have reviewed the triage vital signs and the nursing notes.  Pertinent labs & imaging results that were available during my care of the patient were reviewed by me and considered in my medical decision making (see chart for details).        BP (!) 181/117 (BP Location: Right Arm)   Pulse 100   Temp 98.9 F (37.2 C) (Oral)   Resp 18   Ht 5\' 11"  (1.803 m)   Wt 106.1 kg   SpO2 99%   BMI 32.64 kg/m    Final Clinical Impressions(s) / ED Diagnoses   Final diagnoses:  Hypertensive urgency    ED Discharge Orders         Ordered    amLODipine (NORVASC) 10 MG tablet  Daily     03/29/19 1413    hydrochlorothiazide (HYDRODIURIL) 12.5 MG tablet  Daily     03/29/19 1413         12:43 PM Patient here with symptoms suggestive of asthma exacerbation.  He is in no acute respiratory discomfort.  He does have some faint wheezes on exam.  He would likely benefit from corticosteroid.  He does have rescue inhaler at home.  He is not hypoxic.  Patient sent here from urgent care due to high blood pressure with a systolic of  268.  Currently in the ER, his blood pressure systolic is 341.  He was taking amlodipine and hydrochlorothiazide.  I suspect his elevated blood pressure may be due to recent use of cold medicine.  I have low suspicion for hypertensive emergency.  2:15 PM  BP improves. Pt stable for discharge.  Return precaution given. outpt f/u with PCP for BP recheck recommended.    Fayrene Helper, PA-C 03/29/19 1415    Long, Arlyss Repress, MD 03/29/19 2001

## 2019-03-29 NOTE — ED Notes (Signed)
Patient verbalizes understanding of discharge instructions. Opportunity for questioning and answers were provided. Armband removed by staff, pt discharged from ED ambulatory w/ friend. Stressed importance of PCP f/u for BP meds. Pt verbalized understanding. PA Rona Ravens made aware of d/c BP, ok w/ plan

## 2019-03-29 NOTE — Discharge Instructions (Signed)
Take your blood pressure medication.  Follow up with your doctor for further care.  Sometimes cold medication may elevated your blood pressure.

## 2019-03-29 NOTE — ED Triage Notes (Addendum)
Pt presents to ed for HTN. Pt seen at [facility] for productive cough with white/yellow mucus x2 days. Taking tylenol and using inhaler with some relief   Pt reports hx of HTN, states its "been a long time" since taking his meds. he's currently needing a PCP

## 2019-03-29 NOTE — Discharge Instructions (Addendum)
Your blood pressure is too high to send you home.  Go to the emergency department for evaluation.    209/119, 195/138

## 2019-03-29 NOTE — ED Triage Notes (Signed)
Pt presents to UC w/ c/o headache, productive coughing x2 days. Pt states he gets this every year when seasons change and his "asthma acts up".

## 2019-04-15 ENCOUNTER — Encounter (HOSPITAL_COMMUNITY): Payer: Self-pay | Admitting: *Deleted

## 2019-04-15 ENCOUNTER — Ambulatory Visit (HOSPITAL_COMMUNITY)
Admission: EM | Admit: 2019-04-15 | Discharge: 2019-04-15 | Disposition: A | Discharge status: Home / Self Care | Payer: Self-pay | Attending: Family Medicine | Admitting: Family Medicine

## 2019-04-15 ENCOUNTER — Ambulatory Visit (INDEPENDENT_AMBULATORY_CARE_PROVIDER_SITE_OTHER): Payer: Self-pay

## 2019-04-15 ENCOUNTER — Other Ambulatory Visit: Payer: Self-pay

## 2019-04-15 DIAGNOSIS — S93422A Sprain of deltoid ligament of left ankle, initial encounter: Secondary | ICD-10-CM

## 2019-04-15 NOTE — ED Triage Notes (Signed)
Reports fall while dancing last night.  C/O left anterior ankle pain with difficulty bearing weight.  C/O slight left knee pain.  CMS intact LLE.

## 2019-04-15 NOTE — Discharge Instructions (Signed)
Crutches with no weightbearing and advance to gradual Follow-up with PCP regarding blood pressure management

## 2019-04-15 NOTE — ED Provider Notes (Signed)
[facility]    CSN: [id number] Arrival date & time: 04/15/19  1112      History   Chief Complaint Chief Complaint  Patient presents with  . Fall    HPI Christian Wright is a 33 y.o. male.   Patient fell while dancing last night.  Has been drinking.  Difficult for him to be specific about mechanism of injury except that it sounds like he may have hyperextended his foot.  He denies pain medially or laterally but complains of pain on top of the foot.  HPI  Past Medical History:  Diagnosis Date  . Asthma   . Hypertension     There are no active problems to display for this patient.   History reviewed. No pertinent surgical history.     Home Medications    Prior to Admission medications   Medication Sig Start Date End Date Taking? Authorizing Provider  amLODipine (NORVASC) 10 MG tablet Take 1 tablet (10 mg total) by mouth daily. 03/29/19  Yes Fayrene Helper, PA-C  aspirin EC 81 MG tablet Take 162 mg by mouth daily.   Yes [provider]  hydrochlorothiazide (HYDRODIURIL) 12.5 MG tablet Take 1 tablet (12.5 mg total) by mouth daily. 03/29/19  Yes Fayrene Helper, PA-C  albuterol (PROVENTIL HFA;VENTOLIN HFA) 108 (90 Base) MCG/ACT inhaler Inhale 2 puffs into the lungs every 4 (four) hours as needed for wheezing or shortness of breath. 08/06/16   Pricilla Loveless, MD  guaiFENesin (MUCINEX) 600 MG 12 hr tablet Take by mouth 2 (two) times daily.    [provider]  methocarbamol (ROBAXIN) 500 MG tablet Take 1 tablet (500 mg total) by mouth 2 (two) times daily. 05/02/13   Derwood Kaplan, MD    Family History Family History  Problem Relation Age of Onset  . Healthy Mother   . Hypertension Father   . Cancer Father        colon    Social History Social History   Tobacco Use  . Smoking status: Current Every Day Smoker    Packs/day: 0.50    Types: Cigarettes  . Smokeless tobacco: Never Used  Substance Use Topics  . Alcohol use: Yes   Alcohol/week: 5.0 standard drinks    Types: 5 Standard drinks or equivalent per week    Comment: occasional  . Drug use: Yes    Frequency: 1.0 times per week    Types: Marijuana     Allergies   Patient has no known allergies.   Review of Systems Review of Systems  Musculoskeletal: Positive for gait problem.       Left foot pain  All other systems reviewed and are negative.    Physical Exam Triage Vital Signs ED Triage Vitals [04/15/19 1145]  Enc Vitals Group     BP (!) 164/104     Pulse Rate (!) 105     Resp 16     Temp 98.1 F (36.7 C)     Temp Source Other     SpO2 98 %     Weight      Height      Head Circumference      Peak Flow      Pain Score      Pain Loc      Pain Edu?      Excl. in [place]?    No data found.  Updated Vital Signs BP (!) 164/104 Comment: took HTN med approx 30 min ago  Pulse (!) 105  Temp 98.1 F (36.7 C) (Other (Comment))   Resp 16   SpO2 98%   Visual Acuity Right Eye Distance:   Left Eye Distance:   Bilateral Distance:    Right Eye Near:   Left Eye Near:    Bilateral Near:     Physical Exam Vitals signs and nursing note reviewed.  Constitutional:      Appearance: Normal appearance.  Musculoskeletal:     Comments: Left foot: There is mild tenderness on the superior proximal aspect of the foot. There is no tenderness with compression of either malleoli There is no pain over the lateral aspect of the foot.  Neurological:     Mental Status: He is alert.   X-ray shows no evidence of fracture   UC Treatments / Results  Labs (all labs ordered are listed, but only abnormal results are displayed) Labs Reviewed - No data to display  EKG   Radiology No results found.  Procedures Procedures (including critical care time)  Medications Ordered in UC Medications - No data to display  Initial Impression / Assessment and Plan / UC Course  I have reviewed the triage vital signs and the nursing notes.  Pertinent labs &  imaging results that were available during my care of the patient were reviewed by me and considered in my medical decision making (see chart for details).     Sprain, foot Final Clinical Impressions(s) / UC Diagnoses   Final diagnoses:  None   Discharge Instructions   None    ED Prescriptions    None     PDMP not reviewed this encounter.   Wardell Honour, MD 04/15/19 1259

## 2019-08-14 ENCOUNTER — Ambulatory Visit
Admission: EM | Admit: 2019-08-14 | Discharge: 2019-08-14 | Disposition: A | Discharge status: Home / Self Care | Payer: BC Managed Care – PPO | Attending: Physician Assistant | Admitting: Physician Assistant

## 2019-08-14 DIAGNOSIS — I10 Essential (primary) hypertension: Secondary | ICD-10-CM | POA: Diagnosis not present

## 2019-08-14 DIAGNOSIS — M79671 Pain in right foot: Secondary | ICD-10-CM

## 2019-08-14 MED ORDER — MELOXICAM 7.5 MG PO TABS: 7.5000 mg | ORAL_TABLET | Freq: Every day | ORAL | 0 refills | Status: AC

## 2019-08-14 NOTE — Discharge Instructions (Addendum)
Start Mobic. Do not take ibuprofen (motrin/advil)/ naproxen (aleve) while on mobic. Ice compress, rest, elevation. Follow up with PCP for further evaluation if symptoms not improving.   Please continue your BP medication and document your BP readings. You have an appointment with PCP on 09/17/2019. They will call you prior to appointment to tell you if it is in person, or on the phone. If your blood pressure stays in the 200s and you have chest pain, shortness of breath, headache, confusion, one sided weakness, go to the emergency department for further evaluation needed.

## 2019-08-14 NOTE — ED Triage Notes (Signed)
Pt c/o pain to rt top of foot when walking x3wks. Denies injury.

## 2019-08-14 NOTE — ED Provider Notes (Signed)
[facility]    CSN: [id number] Arrival date & time: 08/14/19  1504      History   Chief Complaint Chief Complaint  Patient presents with  . Foot Pain    HPI Christian Wright is a 34 y.o. male.   34 year old male comes in for 3 week history of right foot pain. Denies injury/trauma. States pain is to the distal dorsal aspect of the foot, worse with weightbearing and movement. Denies pain at rest. Had some swelling that resolved after foot soak. Denies numbness/tingling. Work requires long hours of walking, and is when he experiences the pain most. Has not taken anything for the symptoms.   Patient with history of HTN, takes HCTZ/amlodipine for management.  Does not currently have PCP, but has not been taking medications daily.  States has history of "white coat syndrome", and has significantly elevated blood pressure during office visits.  Although does not check regularly, states his BP usually runs about 140/70s at home.  He is currently hypertensive at 210/124.  He is asymptomatic.  Denies chest pain, shortness of breath, headache, vision changes, one-sided weakness, dizziness, confusion.  States he took his BP medications shortly prior to arrival.     Past Medical History:  Diagnosis Date  . Asthma   . Hypertension     There are no problems to display for this patient.   History reviewed. No pertinent surgical history.     Home Medications    Prior to Admission medications   Medication Sig Start Date End Date Taking? Authorizing Provider  amLODipine (NORVASC) 10 MG tablet Take 1 tablet (10 mg total) by mouth daily. 03/29/19   Fayrene Helper, PA-C  aspirin EC 81 MG tablet Take 162 mg by mouth daily.    [provider]  hydrochlorothiazide (HYDRODIURIL) 12.5 MG tablet Take 1 tablet (12.5 mg total) by mouth daily. 03/29/19   Fayrene Helper, PA-C  meloxicam (MOBIC) 7.5 MG tablet Take 1 tablet (7.5 mg total) by mouth daily. 08/14/19   Belinda Fisher, PA-C     Family History Family History  Problem Relation Age of Onset  . Healthy Mother   . Hypertension Father   . Cancer Father        colon    Social History Social History   Tobacco Use  . Smoking status: Current Every Day Smoker    Packs/day: 0.50    Types: Cigarettes  . Smokeless tobacco: Never Used  Substance Use Topics  . Alcohol use: Yes    Alcohol/week: 5.0 standard drinks    Types: 5 Standard drinks or equivalent per week    Comment: occasional  . Drug use: Yes    Frequency: 1.0 times per week    Types: Marijuana     Allergies   Patient has no known allergies.   Review of Systems Review of Systems  Reason unable to perform ROS: See HPI as above.     Physical Exam Triage Vital Signs ED Triage Vitals  Enc Vitals Group     BP 08/14/19 1537 (!) 210/124     Pulse Rate 08/14/19 1534 (!) 106     Resp 08/14/19 1534 18     Temp 08/14/19 1534 98.7 F (37.1 C)     Temp src --      SpO2 08/14/19 1534 99 %     Weight --      Height --      Head Circumference --  Peak Flow --      Pain Score 08/14/19 1536 5     Pain Loc --      Pain Edu? --      Excl. in [place]? --    No data found.  Updated Vital Signs BP (!) 210/124 (BP Location: Left Arm)   Pulse (!) 106   Temp 98.7 F (37.1 C)   Resp 18   SpO2 99%   Physical Exam Constitutional:      General: He is not in acute distress.    Appearance: Normal appearance. He is well-developed. He is not toxic-appearing or diaphoretic.  HENT:     Head: Normocephalic and atraumatic.  Eyes:     Conjunctiva/sclera: Conjunctivae normal.     Pupils: Pupils are equal, round, and reactive to light.  Cardiovascular:     Rate and Rhythm: Normal rate and regular rhythm.  Pulmonary:     Effort: Pulmonary effort is normal. No respiratory distress.     Comments: Speaking in full sentences without difficulty. LCTAB Musculoskeletal:     Cervical back: Normal range of motion and neck supple.     Comments: No erythema,  warmth, swelling, contusion.  No tenderness to palpation of the ankle or the foot.  Full range of motion of ankle, with dorsiflexion able to reproduce symptoms. Strength 5/5 bilaterally. Sensation intact. Pedal pulse 2+  Skin:    General: Skin is warm and dry.  Neurological:     Mental Status: He is alert and oriented to person, place, and time.      UC Treatments / Results  Labs (all labs ordered are listed, but only abnormal results are displayed) Labs Reviewed - No data to display  EKG   Radiology No results found.  Procedures Procedures (including critical care time)  Medications Ordered in UC Medications - No data to display  Initial Impression / Assessment and Plan / UC Course  I have reviewed the triage vital signs and the nursing notes.  Pertinent labs & imaging results that were available during my care of the patient were reviewed by me and considered in my medical decision making (see chart for details).    Start NSAIDs, ice compress, rest, elevation.  Discussed supportive shoes at work.  Return precautions given.  Patient hypertensive in office at 210/124, he is asymptomatic without signs of hypertensive emergency.  He took his BP medication shortly prior to arrival.  Has history of whitecoat syndrome.  Will have patient recheck BP at home.  Strict return precautions given.  Otherwise primary care appointment made for patient at primary care at [facility].  Follow-up as scheduled for further evaluation and management of hypertension.  Patient expresses understanding and agrees to plan.  Final Clinical Impressions(s) / UC Diagnoses   Final diagnoses:  Right foot pain   ED Prescriptions    Medication Sig Dispense Auth. Provider   meloxicam (MOBIC) 7.5 MG tablet Take 1 tablet (7.5 mg total) by mouth daily. 15 tablet Ok Edwards, PA-C     PDMP not reviewed this encounter.   Ok Edwards, PA-C 08/14/19 1904

## 2019-09-17 ENCOUNTER — Telehealth: Payer: BC Managed Care – PPO | Admitting: Internal Medicine

## 2021-03-17 IMAGING — DX DG FOOT COMPLETE 3+V*L*
3 series · 3 of 3 positions shown · non-contrast
Comparison: None.

CLINICAL DATA: Fell and injured left foot.

EXAM:
LEFT FOOT - COMPLETE 3+ VIEW

[foot ap]
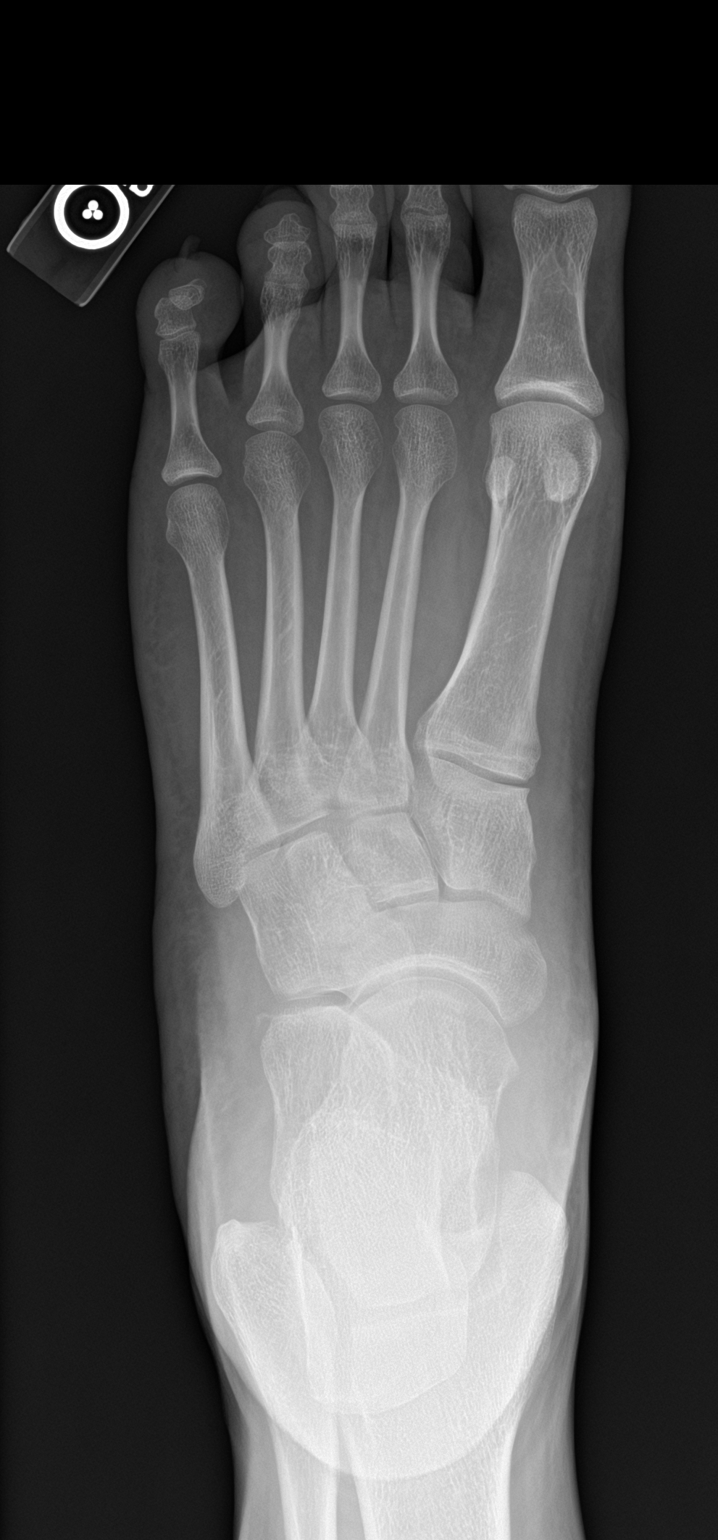

[foot obl]
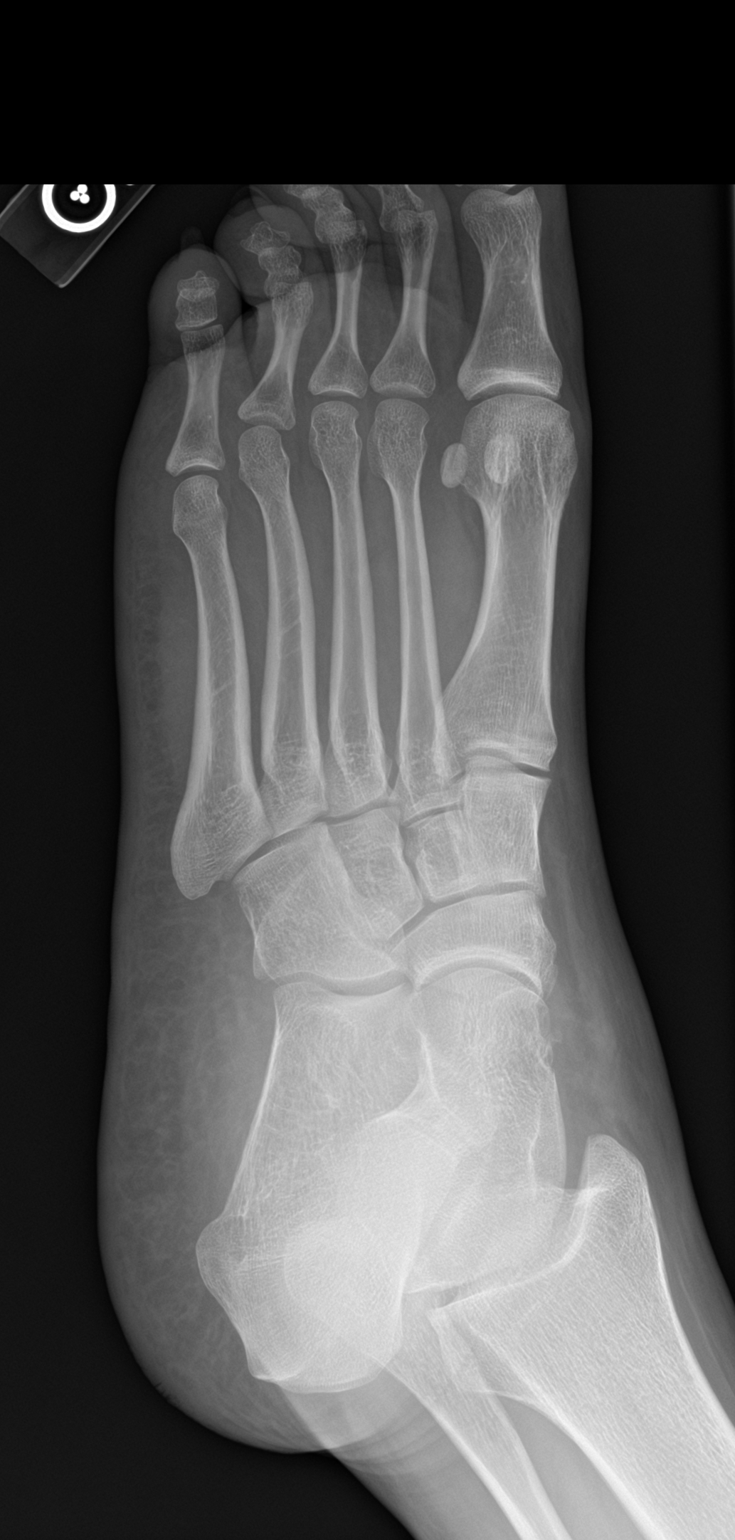

[foot lat]
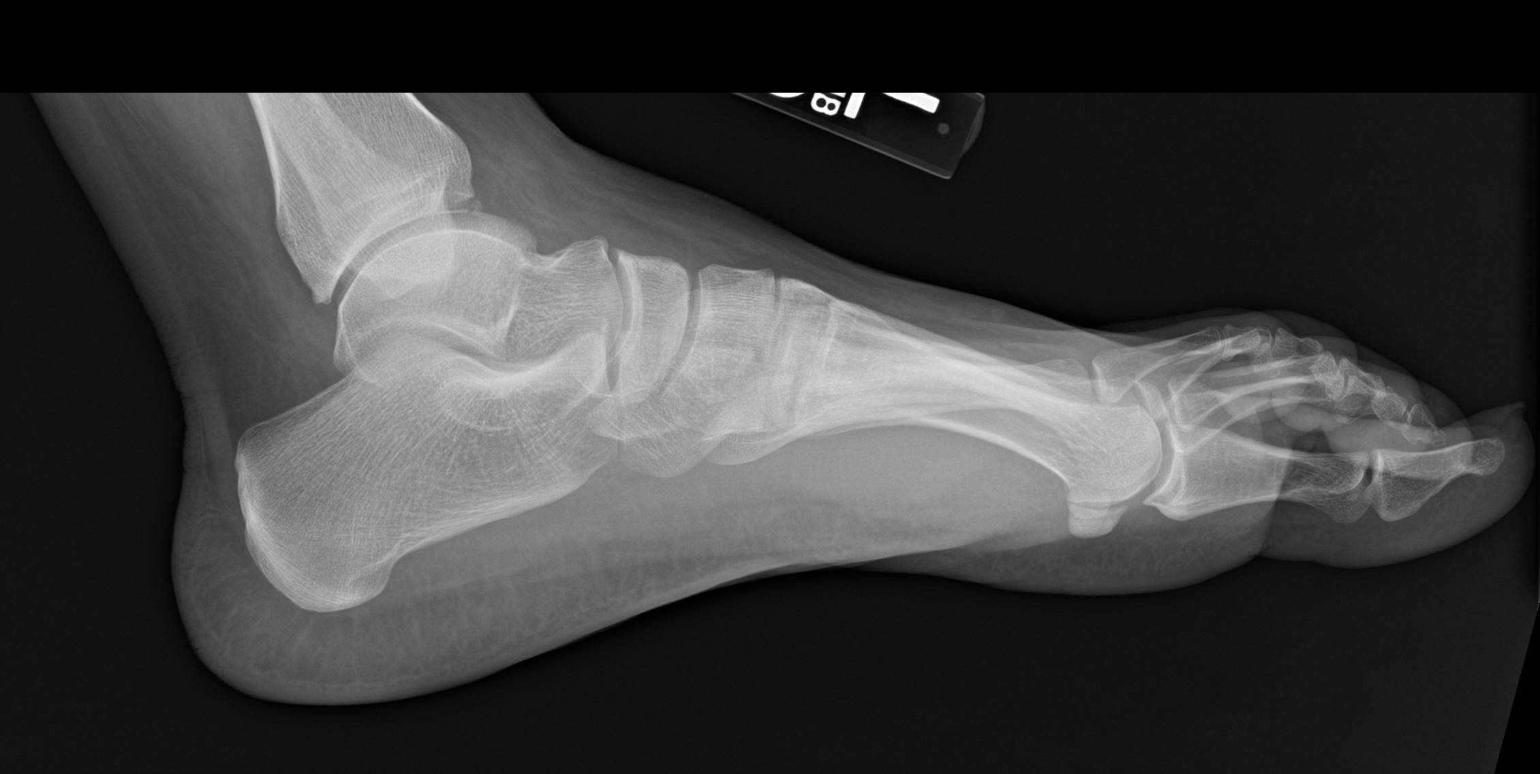

[3 of 3 positions shown; findings below may reference images not displayed]

FINDINGS: The joint spaces are maintained. There is a small avulsion fracture
involving the distal lateral aspect of the calcaneus. No other
fractures are identified.
IMPRESSION: Avulsion fracture involving the distal lateral aspect of the
calcaneus near the calcaneocuboid joint.

No other fractures are identified.
